# Patient Record
Sex: Female | Born: 1985 | Race: Black or African American | Hispanic: No | Marital: Married | Smoking: Never smoker
Health system: Southern US, Community
[De-identification: ages and names within clinical notes are randomized; demographics above are authoritative.]

## PROBLEM LIST (undated history)

## (undated) ENCOUNTER — Inpatient Hospital Stay (HOSPITAL_COMMUNITY): Payer: Self-pay

## (undated) DIAGNOSIS — K589 Irritable bowel syndrome without diarrhea: Secondary | ICD-10-CM

## (undated) DIAGNOSIS — O4693 Antepartum hemorrhage, unspecified, third trimester: Secondary | ICD-10-CM

## (undated) DIAGNOSIS — Z789 Other specified health status: Secondary | ICD-10-CM

## (undated) HISTORY — PX: DILATION AND CURETTAGE OF UTERUS: SHX78

## (undated) HISTORY — DX: Antepartum hemorrhage, unspecified, third trimester: O46.93

## (undated) HISTORY — PX: NO PAST SURGERIES: SHX2092

---
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
## 2010-04-03 NOTE — L&D Delivery Note (Deleted)
Cesarean Section Procedure Note   [name]   03/28/2011 - 03/29/2011  Indications: Breech Presentation and 39 weeks, active labor.  Pre-operative Diagnosis: Breech, 39.[redacted] Weeks Gestation, Labor.   Post-operative Diagnosis: Same   Surgeon: HARPER,CHARLES A  Assistants: Surgical technician  Anesthesia: spinal  Procedure Details:  The patient was seen in the Holding Room. The risks, benefits, complications, treatment options, and expected outcomes were discussed with the patient. The patient concurred with the proposed plan, giving informed consent. The patient was identified as Kathleen Logan and the procedure verified as C-Section Delivery. A Time Out was held and the above information confirmed.  After induction of anesthesia, the patient was draped and prepped in the usual sterile manner. A transverse incision was made and carried down through the subcutaneous tissue to the fascia. The fascial incision was made and extended transversely. The fascia was separated from the underlying rectus tissue superiorly and inferiorly. The peritoneum was identified and entered. The peritoneal incision was extended longitudinally. The utero-vesical peritoneal reflection was incised transversely and the bladder flap was bluntly freed from the lower uterine segment. A low transverse uterine incision was made. Delivered from cephalic presentation was a 3255 gram living newborn female i2nfant(s) with Apgar scores of 8 at one minute and 9 at five minutes. A cord ph was not sent. The umbilical cord was clamped and cut cord. A sample was obtained for evaluation. The placenta was removed Intact and appeared normal.  The uterine incision was closed with running locked sutures of 0 Monocryl.   Hemostasis was observed. The paracolic gutters were irrigated.  The peritoneum was closed with continuous suture of 2-O monocryl.  The fascia was then reapproximated with running sutures of O Vicryl.  Staples were  placed in the skin closure.  Sterile bandage placed.  Instrument, sponge, and needle counts were correct prior the abdominal closure and were correct at the conclusion of the case.    Findings:   Estimated Blood Loss: * No blood loss amount entered *   Total IV Fluids:   Urine Output: 800CC OF clear urine  Specimens: @ORSPECIMEN @   Complications: no complications  Disposition: PACU - hemodynamically stable.  Maternal Condition: stable   Baby condition / location:  nursery-stable    Signed: Surgeon(s): Brock Bad, MD

## 2010-04-12 ENCOUNTER — Inpatient Hospital Stay (HOSPITAL_COMMUNITY)
Admission: AD | Admit: 2010-04-12 | Discharge: 2010-04-12 | Payer: Self-pay | Source: Home / Self Care | Attending: Family Medicine | Admitting: Family Medicine

## 2010-04-18 LAB — URINALYSIS, ROUTINE W REFLEX MICROSCOPIC
Bilirubin Urine: NEGATIVE
Ketones, ur: NEGATIVE mg/dL
Leukocytes, UA: NEGATIVE
Nitrite: NEGATIVE
Protein, ur: NEGATIVE mg/dL
Specific Gravity, Urine: 1.02 (ref 1.005–1.030)
Urine Glucose, Fasting: NEGATIVE mg/dL
Urobilinogen, UA: 0.2 mg/dL (ref 0.0–1.0)
pH: 7 (ref 5.0–8.0)

## 2010-04-18 LAB — URINE MICROSCOPIC-ADD ON

## 2010-04-18 LAB — GC/CHLAMYDIA PROBE AMP, GENITAL
Chlamydia, DNA Probe: POSITIVE — AB
GC Probe Amp, Genital: NEGATIVE

## 2010-04-18 LAB — WET PREP, GENITAL
Trich, Wet Prep: NONE SEEN
Yeast Wet Prep HPF POC: NONE SEEN

## 2010-04-18 LAB — POCT PREGNANCY, URINE: Preg Test, Ur: NEGATIVE

## 2010-07-29 ENCOUNTER — Inpatient Hospital Stay (HOSPITAL_COMMUNITY)
Admission: AD | Admit: 2010-07-29 | Discharge: 2010-07-29 | Disposition: A | Discharge status: Home / Self Care | Payer: 59 | Source: Ambulatory Visit | Attending: Obstetrics and Gynecology | Admitting: Obstetrics and Gynecology

## 2010-07-29 DIAGNOSIS — O99891 Other specified diseases and conditions complicating pregnancy: Secondary | ICD-10-CM | POA: Insufficient documentation

## 2010-07-29 DIAGNOSIS — R109 Unspecified abdominal pain: Secondary | ICD-10-CM

## 2010-07-29 DIAGNOSIS — O210 Mild hyperemesis gravidarum: Secondary | ICD-10-CM | POA: Insufficient documentation

## 2010-07-29 DIAGNOSIS — R102 Pelvic and perineal pain unspecified side: Secondary | ICD-10-CM

## 2010-07-29 DIAGNOSIS — O26899 Other specified pregnancy related conditions, unspecified trimester: Secondary | ICD-10-CM

## 2010-07-29 DIAGNOSIS — O9989 Other specified diseases and conditions complicating pregnancy, childbirth and the puerperium: Secondary | ICD-10-CM

## 2010-07-29 LAB — URINALYSIS, ROUTINE W REFLEX MICROSCOPIC
Bilirubin Urine: NEGATIVE
Glucose, UA: NEGATIVE mg/dL
Ketones, ur: 40 mg/dL — AB
Leukocytes, UA: NEGATIVE
Nitrite: NEGATIVE
Protein, ur: NEGATIVE mg/dL
Specific Gravity, Urine: 1.02 (ref 1.005–1.030)
Urobilinogen, UA: 0.2 mg/dL (ref 0.0–1.0)
pH: 6.5 (ref 5.0–8.0)

## 2010-07-29 LAB — URINE MICROSCOPIC-ADD ON

## 2010-07-30 ENCOUNTER — Inpatient Hospital Stay (HOSPITAL_COMMUNITY): Payer: 59

## 2010-07-30 LAB — WET PREP, GENITAL
Trich, Wet Prep: NONE SEEN
Yeast Wet Prep HPF POC: NONE SEEN

## 2010-07-30 LAB — ABO/RH: ABO/RH(D): O POS

## 2010-07-30 LAB — CBC
HCT: 37.7 % (ref 36.0–46.0)
Hemoglobin: 13 g/dL (ref 12.0–15.0)
MCH: 31.9 pg (ref 26.0–34.0)
MCHC: 34.5 g/dL (ref 30.0–36.0)
MCV: 92.6 fL (ref 78.0–100.0)
Platelets: 273 10*3/uL (ref 150–400)
RBC: 4.07 MIL/uL (ref 3.87–5.11)
RDW: 12.3 % (ref 11.5–15.5)
WBC: 8.9 10*3/uL (ref 4.0–10.5)

## 2010-07-30 LAB — HCG, QUANTITATIVE, PREGNANCY: hCG, Beta Chain, Quant, S: 1543 m[IU]/mL — ABNORMAL HIGH (ref <5)

## 2010-08-01 LAB — GC/CHLAMYDIA PROBE AMP, GENITAL
Chlamydia, DNA Probe: NEGATIVE
GC Probe Amp, Genital: NEGATIVE

## 2010-08-01 LAB — POCT PREGNANCY, URINE: Preg Test, Ur: POSITIVE

## 2010-09-14 ENCOUNTER — Inpatient Hospital Stay (HOSPITAL_COMMUNITY)
Admission: AD | Admit: 2010-09-14 | Discharge: 2010-09-14 | Disposition: A | Discharge status: Home / Self Care | Payer: 59 | Source: Ambulatory Visit | Attending: Obstetrics | Admitting: Obstetrics

## 2010-09-14 LAB — HIV ANTIBODY (ROUTINE TESTING W REFLEX): HIV: NONREACTIVE

## 2010-09-16 LAB — RPR: RPR: NONREACTIVE

## 2010-09-16 LAB — ANTIBODY SCREEN: Antibody Screen: NEGATIVE

## 2010-11-14 ENCOUNTER — Other Ambulatory Visit (HOSPITAL_COMMUNITY): Payer: Self-pay | Admitting: Obstetrics

## 2010-11-14 DIAGNOSIS — Z3689 Encounter for other specified antenatal screening: Secondary | ICD-10-CM

## 2010-11-16 ENCOUNTER — Ambulatory Visit (HOSPITAL_COMMUNITY)
Admission: RE | Admit: 2010-11-16 | Discharge: 2010-11-16 | Disposition: A | Discharge status: Home / Self Care | Payer: 59 | Source: Ambulatory Visit | Attending: Obstetrics | Admitting: Obstetrics

## 2010-11-16 DIAGNOSIS — Z363 Encounter for antenatal screening for malformations: Secondary | ICD-10-CM | POA: Insufficient documentation

## 2010-11-16 DIAGNOSIS — Z3689 Encounter for other specified antenatal screening: Secondary | ICD-10-CM

## 2010-11-16 DIAGNOSIS — Z1389 Encounter for screening for other disorder: Secondary | ICD-10-CM | POA: Insufficient documentation

## 2010-11-16 DIAGNOSIS — O358XX0 Maternal care for other (suspected) fetal abnormality and damage, not applicable or unspecified: Secondary | ICD-10-CM | POA: Insufficient documentation

## 2011-01-15 ENCOUNTER — Encounter (HOSPITAL_COMMUNITY): Payer: Self-pay | Admitting: *Deleted

## 2011-01-15 ENCOUNTER — Inpatient Hospital Stay (HOSPITAL_COMMUNITY)
Admission: AD | Admit: 2011-01-15 | Discharge: 2011-01-15 | Disposition: A | Discharge status: Home / Self Care | Payer: 59 | Source: Ambulatory Visit | Attending: Obstetrics | Admitting: Obstetrics

## 2011-01-15 DIAGNOSIS — O99891 Other specified diseases and conditions complicating pregnancy: Secondary | ICD-10-CM | POA: Insufficient documentation

## 2011-01-15 DIAGNOSIS — R109 Unspecified abdominal pain: Secondary | ICD-10-CM | POA: Insufficient documentation

## 2011-01-15 DIAGNOSIS — N949 Unspecified condition associated with female genital organs and menstrual cycle: Secondary | ICD-10-CM

## 2011-01-15 HISTORY — DX: Other specified health status: Z78.9

## 2011-01-15 LAB — URINALYSIS, ROUTINE W REFLEX MICROSCOPIC
Bilirubin Urine: NEGATIVE
Glucose, UA: NEGATIVE mg/dL
Hgb urine dipstick: NEGATIVE
Ketones, ur: NEGATIVE mg/dL
Leukocytes, UA: NEGATIVE
Nitrite: NEGATIVE
Protein, ur: NEGATIVE mg/dL
Specific Gravity, Urine: 1.01 (ref 1.005–1.030)
Urobilinogen, UA: 0.2 mg/dL (ref 0.0–1.0)
pH: 7.5 (ref 5.0–8.0)

## 2011-01-15 LAB — WET PREP, GENITAL
Clue Cells Wet Prep HPF POC: NONE SEEN
Trich, Wet Prep: NONE SEEN
Yeast Wet Prep HPF POC: NONE SEEN

## 2011-01-15 LAB — POCT FERN TEST: Fern Test: NEGATIVE

## 2011-01-15 NOTE — ED Provider Notes (Signed)
History     Chief Complaint  Patient presents with  . Abdominal Pain   Patient is a 25 y.o. female presenting with abdominal pain.  Abdominal Pain The primary symptoms of the illness include abdominal pain. The primary symptoms of the illness do not include nausea or vomiting.  Kathleen Logan y.o., patient of Dr. Elsie Stain presents with complaints of abdominal pain that began this morning around 10am.  She is now [redacted]w[redacted]d gestation.  She points to the suprapubic area and describes as stretching, burning, cramping and sometimes sharp.  She denies vaginal bleeding, leaking of fluid or discharge.  She denies contractions, nausea and vomiting.   She last had intercourse 2 months ago.  Last ate last night.      Past Medical History  Diagnosis Date  . No pertinent past medical history     Past Surgical History  Procedure Date  . No past surgeries     No family history on file.  History  Substance Use Topics  . Smoking status: Never Smoker   . Smokeless tobacco: Not on file  . Alcohol Use: No    Allergies: No Known Allergies  Prescriptions prior to admission  Medication Sig Dispense Refill  . acetaminophen (TYLENOL) 325 MG tablet Take 650 mg by mouth every 6 (six) hours as needed.        . prenatal vitamin w/FE, FA (PRENATAL 1 + 1) 27-1 MG TABS Take 1 tablet by mouth daily.          Review of Systems  Constitutional: Negative.   Gastrointestinal: Positive for abdominal pain. Negative for nausea and vomiting.  Genitourinary:       Neg for vaginal bleeding, leaking of fluid, and discharge   Physical Exam   Blood pressure 115/65, pulse 90, temperature 98.4 F (36.9 C), temperature source Oral, resp. rate 18, height 5' 5.5" (1.664 m), weight 145 lb (65.772 kg).  Physical Exam  Nursing note and vitals reviewed. Constitutional: She is oriented to person, place, and time. She appears well-developed and well-nourished.  HENT:  Head: Normocephalic.  Neck: Normal range of  motion.  Respiratory: Effort normal.  GI: Soft. There is Tenderness: lower abdominal, suprapubic.. There is no rebound and no guarding.  Genitourinary: Vaginal discharge (thick, white) found.       Cervical exam by Blanche East, RN.  Closed, thick soft.  Neurological: She is alert and oriented to person, place, and time.  Skin: Skin is warm and dry.   Results for orders placed during the hospital encounter of 01/15/11 (from the past 24 hour(s))  URINALYSIS, ROUTINE W REFLEX MICROSCOPIC     Status: Abnormal   Collection Time   01/15/11 12:13 PM      Component Value Range   Color, Urine STRAW (*) YELLOW    Appearance CLEAR  CLEAR    Specific Gravity, Urine 1.010  1.005 - 1.030    pH 7.5  5.0 - 8.0    Glucose, UA NEGATIVE  NEGATIVE (mg/dL)   Hgb urine dipstick NEGATIVE  NEGATIVE    Bilirubin Urine NEGATIVE  NEGATIVE    Ketones, ur NEGATIVE  NEGATIVE (mg/dL)   Protein, ur NEGATIVE  NEGATIVE (mg/dL)   Urobilinogen, UA 0.2  0.0 - 1.0 (mg/dL)   Nitrite NEGATIVE  NEGATIVE    Leukocytes, UA NEGATIVE  NEGATIVE   WET PREP, GENITAL     Status: Abnormal   Collection Time   01/15/11  1:50 PM      Component Value Range  Yeast, Wet Prep NONE SEEN  NONE SEEN    Trich, Wet Prep NONE SEEN  NONE SEEN    Clue Cells, Wet Prep NONE SEEN  NONE SEEN    WBC, Wet Prep HPF POC FEW (*) NONE SEEN   POCT FERN TEST     Status: Normal   Collection Time   01/15/11  2:02 PM      Component Value Range   Fern Test Negative     MAU Course  Procedures   Cervical exam by Blanche East, RN closed, soft and thick.  Large amount of white discharge.  Wet prep sent to lab.  MDM 13:50 Reported patients MSE and FM findings to Dr. Tamela Oddi.  Orders given for cervical exam .  Sent FFN if cervical changes. 14:20  Reported cervical and  lab findings. FMS findings also reported.  No further contractions with hydration. Order given to discharge patient. Assessment and Plan  A:  Round ligament pain   P:  Dr. Tamela Oddi  gave instructions for patient to call Dr. Gaynell Face and be seen tomorrow.      Instructed patient to return should her symptoms change--vaginal bleeding, increased abdominal pain or loss of fluid.  Avoid intercourse until she sees Dr. Gaynell Face.  Kathleen Logan,EVE M 01/15/2011, 1:25 PM   Matt Holmes, NP 01/15/11 1433

## 2011-01-15 NOTE — Progress Notes (Signed)
Pt is a G1P0 at 29 weeks. Presents to MAU with complaints of intermt. Lower abdominal pain that started this morning. No complaints of vag. Discharge or bleeding.

## 2011-01-15 NOTE — ED Notes (Signed)
During cervical exam done by RN, RN observed large amounts of vaginal discharge (white) notified E. Key NP wet prep obtained.

## 2011-01-15 NOTE — Progress Notes (Signed)
Pt reports having lower abd pain and pressure and back pain /discomfort that started this morning. Pain comes and goes. Reports good fetal movement. Denies vaginal bleeding or discharge

## 2011-03-22 LAB — STREP B DNA PROBE: GBS: NEGATIVE

## 2011-03-28 ENCOUNTER — Inpatient Hospital Stay (HOSPITAL_COMMUNITY)
Admission: AD | Admit: 2011-03-28 | Discharge: 2011-04-01 | DRG: 766 | Disposition: A | Discharge status: Home / Self Care | Payer: Managed Care, Other (non HMO) | Source: Ambulatory Visit | Attending: Obstetrics | Admitting: Obstetrics

## 2011-03-28 ENCOUNTER — Encounter (HOSPITAL_COMMUNITY): Payer: Self-pay | Admitting: *Deleted

## 2011-03-28 DIAGNOSIS — Z98891 History of uterine scar from previous surgery: Secondary | ICD-10-CM

## 2011-03-28 DIAGNOSIS — O321XX0 Maternal care for breech presentation, not applicable or unspecified: Principal | ICD-10-CM | POA: Diagnosis present

## 2011-03-28 LAB — POCT FERN TEST
Fern Test: NEGATIVE
Fern Test: POSITIVE

## 2011-03-28 LAB — CBC
HCT: 32.9 % — ABNORMAL LOW (ref 36.0–46.0)
Hemoglobin: 11.5 g/dL — ABNORMAL LOW (ref 12.0–15.0)
RBC: 3.63 MIL/uL — ABNORMAL LOW (ref 3.87–5.11)

## 2011-03-28 LAB — RPR: RPR Ser Ql: NONREACTIVE

## 2011-03-28 MED ORDER — ACETAMINOPHEN 325 MG PO TABS: 650.0000 mg | ORAL_TABLET | ORAL | Status: DC | PRN

## 2011-03-28 MED ORDER — CITRIC ACID-SODIUM CITRATE 334-500 MG/5ML PO SOLN
30.0000 mL | ORAL | Status: DC | PRN
  Administered 2011-03-29: 30 mL via ORAL
  Filled 2011-03-28: qty 15

## 2011-03-28 MED ORDER — ONDANSETRON HCL 4 MG/2ML IJ SOLN: 4.0000 mg | Freq: Four times a day (QID) | INTRAMUSCULAR | Status: DC | PRN

## 2011-03-28 MED ORDER — LIDOCAINE HCL (PF) 1 % IJ SOLN: 30.0000 mL | INTRAMUSCULAR | Status: DC | PRN

## 2011-03-28 MED ORDER — LACTATED RINGERS IV SOLN
INTRAVENOUS | Status: DC
  Administered 2011-03-28: 21:00:00 via INTRAVENOUS
  Administered 2011-03-28: 900 mL via INTRAVENOUS
  Administered 2011-03-29: 01:00:00 via INTRAVENOUS

## 2011-03-28 MED ORDER — OXYTOCIN 20 UNITS IN LACTATED RINGERS INFUSION - SIMPLE: 125.0000 mL/h | Freq: Once | INTRAVENOUS | Status: DC

## 2011-03-28 MED ORDER — NALBUPHINE HCL 10 MG/ML IJ SOLN
10.0000 mg | INTRAMUSCULAR | Status: DC | PRN
  Administered 2011-03-28 (×2): 10 mg via INTRAVENOUS

## 2011-03-28 MED ORDER — OXYCODONE-ACETAMINOPHEN 5-325 MG PO TABS: 2.0000 | ORAL_TABLET | ORAL | Status: DC | PRN

## 2011-03-28 MED ORDER — OXYTOCIN BOLUS FROM INFUSION
500.0000 mL | Freq: Once | INTRAVENOUS | Status: DC
  Filled 2011-03-28: qty 1000
  Filled 2011-03-28: qty 500

## 2011-03-28 MED ORDER — OXYTOCIN 20 UNITS IN LACTATED RINGERS INFUSION - SIMPLE
1.0000 m[IU]/min | INTRAVENOUS | Status: DC
  Administered 2011-03-28: 2 m[IU]/min via INTRAVENOUS

## 2011-03-28 MED ORDER — NALBUPHINE SYRINGE 5 MG/0.5 ML
10.0000 mg | INJECTION | Freq: Four times a day (QID) | INTRAMUSCULAR | Status: DC | PRN
  Administered 2011-03-28: 10 mg via INTRAMUSCULAR
  Filled 2011-03-28: qty 0.5
  Filled 2011-03-28: qty 2
  Filled 2011-03-28: qty 0.5

## 2011-03-28 MED ORDER — OXYTOCIN 10 UNIT/ML IJ SOLN
20.0000 [IU] | INTRAVENOUS | Status: DC
  Administered 2011-03-29: 20 [IU] via INTRAVENOUS
  Filled 2011-03-28: qty 2

## 2011-03-28 MED ORDER — IBUPROFEN 600 MG PO TABS: 600.0000 mg | ORAL_TABLET | Freq: Four times a day (QID) | ORAL | Status: DC | PRN

## 2011-03-28 MED ORDER — FLEET ENEMA 7-19 GM/118ML RE ENEM: 1.0000 | ENEMA | RECTAL | Status: DC | PRN

## 2011-03-28 MED ORDER — LACTATED RINGERS IV SOLN
500.0000 mL | INTRAVENOUS | Status: DC | PRN
  Administered 2011-03-29: 500 mL via INTRAVENOUS

## 2011-03-28 MED ORDER — TERBUTALINE SULFATE 1 MG/ML IJ SOLN: 0.2500 mg | Freq: Once | INTRAMUSCULAR | Status: AC | PRN

## 2011-03-28 MED ORDER — HYDROXYZINE HCL 50 MG/ML IM SOLN
50.0000 mg | Freq: Four times a day (QID) | INTRAMUSCULAR | Status: DC | PRN
  Administered 2011-03-28: 50 mg via INTRAMUSCULAR
  Filled 2011-03-28: qty 1

## 2011-03-28 NOTE — Progress Notes (Signed)
Pt c/o leaking, clear, fluid since 0500.  Changed underware X3 this morning.

## 2011-03-28 NOTE — Progress Notes (Signed)
Patient states she has had three episodes of leaking clear fluid since 0530. Has been having contractions. Reports good fetal movement.

## 2011-03-28 NOTE — Progress Notes (Signed)
First Fern test negative.  NP to do sterile spec to rule out ROM.

## 2011-03-29 ENCOUNTER — Encounter (HOSPITAL_COMMUNITY): Payer: Self-pay

## 2011-03-29 ENCOUNTER — Inpatient Hospital Stay (HOSPITAL_COMMUNITY): Payer: Managed Care, Other (non HMO) | Admitting: Anesthesiology

## 2011-03-29 ENCOUNTER — Inpatient Hospital Stay (HOSPITAL_COMMUNITY): Payer: Managed Care, Other (non HMO)

## 2011-03-29 ENCOUNTER — Encounter (HOSPITAL_COMMUNITY): Payer: Self-pay | Admitting: Anesthesiology

## 2011-03-29 ENCOUNTER — Encounter (HOSPITAL_COMMUNITY)
Admission: AD | Disposition: A | Discharge status: Home / Self Care | Payer: Self-pay | Source: Ambulatory Visit | Attending: Obstetrics

## 2011-03-29 ENCOUNTER — Other Ambulatory Visit: Payer: Self-pay | Admitting: Obstetrics

## 2011-03-29 DIAGNOSIS — Z98891 History of uterine scar from previous surgery: Secondary | ICD-10-CM

## 2011-03-29 SURGERY — Surgical Case
Anesthesia: Spinal | Site: Abdomen | Wound class: Clean Contaminated

## 2011-03-29 MED ORDER — FENTANYL CITRATE 0.05 MG/ML IJ SOLN
INTRAMUSCULAR | Status: AC
  Filled 2011-03-29: qty 2

## 2011-03-29 MED ORDER — PHENYLEPHRINE 40 MCG/ML (10ML) SYRINGE FOR IV PUSH (FOR BLOOD PRESSURE SUPPORT)
80.0000 ug | PREFILLED_SYRINGE | INTRAVENOUS | Status: DC | PRN
  Filled 2011-03-29: qty 5

## 2011-03-29 MED ORDER — DIPHENHYDRAMINE HCL 50 MG/ML IJ SOLN: 12.5000 mg | INTRAMUSCULAR | Status: DC | PRN

## 2011-03-29 MED ORDER — DIPHENHYDRAMINE HCL 50 MG/ML IJ SOLN: 25.0000 mg | INTRAMUSCULAR | Status: DC | PRN

## 2011-03-29 MED ORDER — SENNOSIDES-DOCUSATE SODIUM 8.6-50 MG PO TABS
2.0000 | ORAL_TABLET | Freq: Every day | ORAL | Status: DC
  Administered 2011-03-29 – 2011-03-31 (×3): 2 via ORAL

## 2011-03-29 MED ORDER — FENTANYL CITRATE 0.05 MG/ML IJ SOLN
INTRAMUSCULAR | Status: DC | PRN
  Administered 2011-03-29: 25 ug via INTRAVENOUS
  Administered 2011-03-29: 25 ug via INTRATHECAL
  Administered 2011-03-29: 50 ug via INTRAVENOUS

## 2011-03-29 MED ORDER — MORPHINE SULFATE (PF) 0.5 MG/ML IJ SOLN
INTRAMUSCULAR | Status: DC | PRN
  Administered 2011-03-29: 1 mg via INTRAVENOUS
  Administered 2011-03-29 (×2): 1 mg via EPIDURAL
  Administered 2011-03-29: .85 mg via EPIDURAL
  Administered 2011-03-29: 1 mg via EPIDURAL

## 2011-03-29 MED ORDER — ACETAMINOPHEN 325 MG PO TABS: 650.0000 mg | ORAL_TABLET | Freq: Four times a day (QID) | ORAL | Status: DC | PRN

## 2011-03-29 MED ORDER — MEPERIDINE HCL 25 MG/ML IJ SOLN: 6.2500 mg | INTRAMUSCULAR | Status: DC | PRN

## 2011-03-29 MED ORDER — MORPHINE SULFATE (PF) 0.5 MG/ML IJ SOLN
INTRAMUSCULAR | Status: DC | PRN
  Administered 2011-03-29: .15 mg via INTRATHECAL

## 2011-03-29 MED ORDER — METOCLOPRAMIDE HCL 5 MG/ML IJ SOLN
10.0000 mg | Freq: Three times a day (TID) | INTRAMUSCULAR | Status: DC | PRN
Start: 2011-03-29 — End: 2011-04-01

## 2011-03-29 MED ORDER — EPHEDRINE 5 MG/ML INJ: 10.0000 mg | INTRAVENOUS | Status: DC | PRN

## 2011-03-29 MED ORDER — METHYLERGONOVINE MALEATE 0.2 MG/ML IJ SOLN
0.2000 mg | INTRAMUSCULAR | Status: DC | PRN
  Administered 2011-03-29: 0.2 mg via INTRAMUSCULAR

## 2011-03-29 MED ORDER — SIMETHICONE 80 MG PO CHEW: 80.0000 mg | CHEWABLE_TABLET | ORAL | Status: DC | PRN

## 2011-03-29 MED ORDER — KETOROLAC TROMETHAMINE 30 MG/ML IJ SOLN
INTRAMUSCULAR | Status: AC
  Administered 2011-03-29: 30 mg via INTRAVENOUS
  Filled 2011-03-29: qty 1

## 2011-03-29 MED ORDER — TETANUS-DIPHTH-ACELL PERTUSSIS 5-2.5-18.5 LF-MCG/0.5 IM SUSP: 0.5000 mL | Freq: Once | INTRAMUSCULAR | Status: DC

## 2011-03-29 MED ORDER — MIDAZOLAM HCL 2 MG/2ML IJ SOLN
INTRAMUSCULAR | Status: AC
  Filled 2011-03-29: qty 2

## 2011-03-29 MED ORDER — WITCH HAZEL-GLYCERIN EX PADS: 1.0000 "application " | MEDICATED_PAD | CUTANEOUS | Status: DC | PRN

## 2011-03-29 MED ORDER — OXYTOCIN 20 UNITS IN LACTATED RINGERS INFUSION - SIMPLE
INTRAVENOUS | Status: AC
  Filled 2011-03-29: qty 1000

## 2011-03-29 MED ORDER — IBUPROFEN 600 MG PO TABS
600.0000 mg | ORAL_TABLET | Freq: Four times a day (QID) | ORAL | Status: DC
  Administered 2011-03-29 – 2011-04-01 (×14): 600 mg via ORAL
  Filled 2011-03-29 (×12): qty 1
  Filled 2011-03-29: qty 2

## 2011-03-29 MED ORDER — METHYLERGONOVINE MALEATE 0.2 MG PO TABS: 0.2000 mg | ORAL_TABLET | ORAL | Status: DC | PRN

## 2011-03-29 MED ORDER — ZOLPIDEM TARTRATE 5 MG PO TABS: 5.0000 mg | ORAL_TABLET | Freq: Every evening | ORAL | Status: DC | PRN

## 2011-03-29 MED ORDER — EPHEDRINE 5 MG/ML INJ
10.0000 mg | INTRAVENOUS | Status: DC | PRN
  Filled 2011-03-29: qty 4

## 2011-03-29 MED ORDER — MENTHOL 3 MG MT LOZG: 1.0000 | LOZENGE | OROMUCOSAL | Status: DC | PRN

## 2011-03-29 MED ORDER — OXYTOCIN 10 UNIT/ML IJ SOLN
INTRAMUSCULAR | Status: AC
  Filled 2011-03-29: qty 4

## 2011-03-29 MED ORDER — MEDROXYPROGESTERONE ACETATE 150 MG/ML IM SUSP: 150.0000 mg | INTRAMUSCULAR | Status: DC | PRN

## 2011-03-29 MED ORDER — PRENATAL MULTIVITAMIN CH
1.0000 | ORAL_TABLET | Freq: Every day | ORAL | Status: DC
  Administered 2011-03-29 – 2011-03-31 (×4): 1 via ORAL
  Filled 2011-03-29 (×4): qty 1

## 2011-03-29 MED ORDER — IBUPROFEN 600 MG PO TABS: 600.0000 mg | ORAL_TABLET | Freq: Four times a day (QID) | ORAL | Status: DC | PRN

## 2011-03-29 MED ORDER — NALBUPHINE SYRINGE 5 MG/0.5 ML
5.0000 mg | INJECTION | INTRAMUSCULAR | Status: DC | PRN
  Filled 2011-03-29: qty 1

## 2011-03-29 MED ORDER — ONDANSETRON HCL 4 MG/2ML IJ SOLN: 4.0000 mg | INTRAMUSCULAR | Status: DC | PRN

## 2011-03-29 MED ORDER — KETOROLAC TROMETHAMINE 30 MG/ML IJ SOLN: 30.0000 mg | Freq: Four times a day (QID) | INTRAMUSCULAR | Status: AC | PRN

## 2011-03-29 MED ORDER — DIPHENHYDRAMINE HCL 25 MG PO CAPS: 25.0000 mg | ORAL_CAPSULE | Freq: Four times a day (QID) | ORAL | Status: DC | PRN

## 2011-03-29 MED ORDER — METHYLERGONOVINE MALEATE 0.2 MG/ML IJ SOLN
INTRAMUSCULAR | Status: AC
  Administered 2011-03-29: 0.2 mg via INTRAMUSCULAR
  Filled 2011-03-29: qty 1

## 2011-03-29 MED ORDER — SODIUM CHLORIDE 0.9 % IV SOLN
1.0000 ug/kg/h | INTRAVENOUS | Status: DC | PRN
  Filled 2011-03-29: qty 2.5

## 2011-03-29 MED ORDER — SODIUM CHLORIDE 0.9 % IJ SOLN: 3.0000 mL | INTRAMUSCULAR | Status: DC | PRN

## 2011-03-29 MED ORDER — CEFAZOLIN SODIUM 1-5 GM-% IV SOLN
INTRAVENOUS | Status: DC | PRN
  Administered 2011-03-29: 1 g via INTRAVENOUS

## 2011-03-29 MED ORDER — SCOPOLAMINE 1 MG/3DAYS TD PT72
1.0000 | MEDICATED_PATCH | Freq: Once | TRANSDERMAL | Status: AC
  Administered 2011-03-29: 1.5 mg via TRANSDERMAL

## 2011-03-29 MED ORDER — OXYTOCIN 20 UNITS IN LACTATED RINGERS INFUSION - SIMPLE: 125.0000 mL/h | INTRAVENOUS | Status: AC

## 2011-03-29 MED ORDER — MEPERIDINE HCL 25 MG/ML IJ SOLN
INTRAMUSCULAR | Status: AC
  Filled 2011-03-29: qty 1

## 2011-03-29 MED ORDER — METOCLOPRAMIDE HCL 5 MG/ML IJ SOLN: 10.0000 mg | Freq: Once | INTRAMUSCULAR | Status: DC | PRN

## 2011-03-29 MED ORDER — PRENATAL MULTIVITAMIN CH: 1.0000 | ORAL_TABLET | Freq: Every day | ORAL | Status: DC

## 2011-03-29 MED ORDER — FENTANYL CITRATE 0.05 MG/ML IJ SOLN: 50.0000 ug | Freq: Once | INTRAMUSCULAR | Status: DC

## 2011-03-29 MED ORDER — FENTANYL CITRATE 0.05 MG/ML IJ SOLN
INTRAMUSCULAR | Status: AC
  Administered 2011-03-29: 50 ug via INTRAVENOUS
  Filled 2011-03-29: qty 2

## 2011-03-29 MED ORDER — ONDANSETRON HCL 4 MG/2ML IJ SOLN
INTRAMUSCULAR | Status: DC | PRN
  Administered 2011-03-29: 4 mg via INTRAVENOUS

## 2011-03-29 MED ORDER — MORPHINE SULFATE 0.5 MG/ML IJ SOLN
INTRAMUSCULAR | Status: AC
  Filled 2011-03-29: qty 10

## 2011-03-29 MED ORDER — LACTATED RINGERS IV SOLN
INTRAVENOUS | Status: DC
Start: 2011-03-29 — End: 2011-04-01
  Administered 2011-03-29: 11:00:00 via INTRAVENOUS

## 2011-03-29 MED ORDER — MIDAZOLAM HCL 5 MG/5ML IJ SOLN
INTRAMUSCULAR | Status: DC | PRN
  Administered 2011-03-29: 1 mg via INTRAVENOUS

## 2011-03-29 MED ORDER — DIPHENHYDRAMINE HCL 25 MG PO CAPS: 25.0000 mg | ORAL_CAPSULE | ORAL | Status: DC | PRN

## 2011-03-29 MED ORDER — SIMETHICONE 80 MG PO CHEW
80.0000 mg | CHEWABLE_TABLET | Freq: Three times a day (TID) | ORAL | Status: DC
  Administered 2011-03-29 – 2011-03-31 (×10): 80 mg via ORAL

## 2011-03-29 MED ORDER — ONDANSETRON HCL 4 MG/2ML IJ SOLN
INTRAMUSCULAR | Status: AC
  Filled 2011-03-29: qty 2

## 2011-03-29 MED ORDER — NALOXONE HCL 0.4 MG/ML IJ SOLN: 0.4000 mg | INTRAMUSCULAR | Status: DC | PRN

## 2011-03-29 MED ORDER — MEPERIDINE HCL 25 MG/ML IJ SOLN
INTRAMUSCULAR | Status: DC | PRN
  Administered 2011-03-29: 6 mg via INTRAVENOUS
  Administered 2011-03-29: 7 mg via INTRAVENOUS
  Administered 2011-03-29 (×2): 6 mg via INTRAVENOUS

## 2011-03-29 MED ORDER — SCOPOLAMINE 1 MG/3DAYS TD PT72
MEDICATED_PATCH | TRANSDERMAL | Status: AC
  Administered 2011-03-29: 1.5 mg via TRANSDERMAL
  Filled 2011-03-29: qty 1

## 2011-03-29 MED ORDER — OXYCODONE-ACETAMINOPHEN 5-325 MG PO TABS
1.0000 | ORAL_TABLET | ORAL | Status: DC | PRN
  Administered 2011-03-29: 1 via ORAL
  Administered 2011-03-29 – 2011-03-30 (×3): 2 via ORAL
  Administered 2011-03-30 – 2011-04-01 (×3): 1 via ORAL
  Filled 2011-03-29 (×2): qty 1
  Filled 2011-03-29: qty 2
  Filled 2011-03-29: qty 1
  Filled 2011-03-29: qty 2
  Filled 2011-03-29: qty 1
  Filled 2011-03-29: qty 2

## 2011-03-29 MED ORDER — OXYTOCIN 10 UNIT/ML IJ SOLN
INTRAMUSCULAR | Status: AC
  Filled 2011-03-29: qty 2

## 2011-03-29 MED ORDER — LACTATED RINGERS IV SOLN
INTRAVENOUS | Status: DC | PRN
  Administered 2011-03-29 (×3): via INTRAVENOUS

## 2011-03-29 MED ORDER — FENTANYL 2.5 MCG/ML BUPIVACAINE 1/10 % EPIDURAL INFUSION (WH - ANES)
14.0000 mL/h | INTRAMUSCULAR | Status: DC
  Filled 2011-03-29: qty 60

## 2011-03-29 MED ORDER — CEFAZOLIN SODIUM 1-5 GM-% IV SOLN
INTRAVENOUS | Status: AC
  Filled 2011-03-29: qty 50

## 2011-03-29 MED ORDER — KETOROLAC TROMETHAMINE 30 MG/ML IJ SOLN
30.0000 mg | Freq: Four times a day (QID) | INTRAMUSCULAR | Status: AC | PRN
  Administered 2011-03-29: 30 mg via INTRAVENOUS

## 2011-03-29 MED ORDER — LACTATED RINGERS IV SOLN: 500.0000 mL | Freq: Once | INTRAVENOUS | Status: DC

## 2011-03-29 MED ORDER — OXYTOCIN 10 UNIT/ML IJ SOLN
40.0000 [IU] | Freq: Once | INTRAVENOUS | Status: AC
  Administered 2011-03-29: 40 [IU] via INTRAVENOUS

## 2011-03-29 MED ORDER — LANOLIN HYDROUS EX OINT: 1.0000 "application " | TOPICAL_OINTMENT | CUTANEOUS | Status: DC | PRN

## 2011-03-29 MED ORDER — ONDANSETRON HCL 4 MG/2ML IJ SOLN: 4.0000 mg | Freq: Three times a day (TID) | INTRAMUSCULAR | Status: DC | PRN

## 2011-03-29 MED ORDER — PHENYLEPHRINE 40 MCG/ML (10ML) SYRINGE FOR IV PUSH (FOR BLOOD PRESSURE SUPPORT): 80.0000 ug | PREFILLED_SYRINGE | INTRAVENOUS | Status: DC | PRN

## 2011-03-29 MED ORDER — FENTANYL CITRATE 0.05 MG/ML IJ SOLN
25.0000 ug | INTRAMUSCULAR | Status: DC | PRN
  Administered 2011-03-29 (×4): 50 ug via INTRAVENOUS

## 2011-03-29 MED ORDER — DIBUCAINE 1 % RE OINT: 1.0000 "application " | TOPICAL_OINTMENT | RECTAL | Status: DC | PRN

## 2011-03-29 MED ORDER — SODIUM CHLORIDE 0.9 % IR SOLN
Status: DC | PRN
  Administered 2011-03-29: 1000 mL

## 2011-03-29 MED ORDER — ONDANSETRON HCL 4 MG PO TABS: 4.0000 mg | ORAL_TABLET | ORAL | Status: DC | PRN

## 2011-03-29 MED ORDER — BUPIVACAINE IN DEXTROSE 0.75-8.25 % IT SOLN
INTRATHECAL | Status: DC | PRN
  Administered 2011-03-29: 1.7 mL via INTRATHECAL

## 2011-03-29 SURGICAL SUPPLY — 42 items
ADH SKN CLS APL DERMABOND .7 (GAUZE/BANDAGES/DRESSINGS) ×1
CANISTER WOUND CARE 500ML ATS (WOUND CARE) IMPLANT
CHLORAPREP W/TINT 26ML (MISCELLANEOUS) ×2 IMPLANT
CLOTH BEACON ORANGE TIMEOUT ST (SAFETY) ×2 IMPLANT
CONTAINER PREFILL 10% NBF 15ML (MISCELLANEOUS) ×2 IMPLANT
DERMABOND ADVANCED (GAUZE/BANDAGES/DRESSINGS) ×1
DERMABOND ADVANCED .7 DNX12 (GAUZE/BANDAGES/DRESSINGS) ×1 IMPLANT
DRSG COVADERM 4X6 (GAUZE/BANDAGES/DRESSINGS) ×1 IMPLANT
DRSG VAC ATS LRG SENSATRAC (GAUZE/BANDAGES/DRESSINGS) IMPLANT
DRSG VAC ATS MED SENSATRAC (GAUZE/BANDAGES/DRESSINGS) IMPLANT
DRSG VAC ATS SM SENSATRAC (GAUZE/BANDAGES/DRESSINGS) IMPLANT
ELECT REM PT RETURN 9FT ADLT (ELECTROSURGICAL) ×2
ELECTRODE REM PT RTRN 9FT ADLT (ELECTROSURGICAL) ×1 IMPLANT
EXTRACTOR VACUUM M CUP 4 TUBE (SUCTIONS) IMPLANT
GLOVE BIO SURGEON STRL SZ8 (GLOVE) ×4 IMPLANT
GLOVE SKINSENSE NS SZ7.0 (GLOVE) ×2
GLOVE SKINSENSE STRL SZ7.0 (GLOVE) IMPLANT
GOWN PREVENTION PLUS LG XLONG (DISPOSABLE) ×4 IMPLANT
GOWN PREVENTION PLUS XLARGE (GOWN DISPOSABLE) ×2 IMPLANT
KIT ABG SYR 3ML LUER SLIP (SYRINGE) IMPLANT
NDL HYPO 25X5/8 SAFETYGLIDE (NEEDLE) ×1 IMPLANT
NEEDLE HYPO 25X5/8 SAFETYGLIDE (NEEDLE) IMPLANT
NS IRRIG 1000ML POUR BTL (IV SOLUTION) ×2 IMPLANT
PACK C SECTION WH (CUSTOM PROCEDURE TRAY) ×2 IMPLANT
RTRCTR C-SECT PINK 25CM LRG (MISCELLANEOUS) IMPLANT
SLEEVE SCD COMPRESS KNEE MED (MISCELLANEOUS) ×1 IMPLANT
STAPLER VISISTAT 35W (STAPLE) ×1 IMPLANT
SUT GUT PLAIN 0 CT-3 TAN 27 (SUTURE) ×1 IMPLANT
SUT MNCRL 0 VIOLET CTX 36 (SUTURE) ×3 IMPLANT
SUT MNCRL AB 4-0 PS2 18 (SUTURE) IMPLANT
SUT MON AB 2-0 CT1 27 (SUTURE) ×2 IMPLANT
SUT MON AB 3-0 SH 27 (SUTURE)
SUT MON AB 3-0 SH27 (SUTURE) IMPLANT
SUT MONOCRYL 0 CTX 36 (SUTURE) ×3
SUT PLAIN 2 0 XLH (SUTURE) IMPLANT
SUT VIC AB 0 CTX 36 (SUTURE) ×4
SUT VIC AB 0 CTX36XBRD ANBCTRL (SUTURE) ×2 IMPLANT
SUT VIC AB 2-0 CT1 27 (SUTURE)
SUT VIC AB 2-0 CT1 TAPERPNT 27 (SUTURE) IMPLANT
TOWEL OR 17X24 6PK STRL BLUE (TOWEL DISPOSABLE) ×4 IMPLANT
TRAY FOLEY CATH 14FR (SET/KITS/TRAYS/PACK) ×2 IMPLANT
WATER STERILE IRR 1000ML POUR (IV SOLUTION) ×1 IMPLANT

## 2011-03-29 NOTE — Op Note (Signed)
Brock Bad, MD Physician Signed Obstetrics L&D Delivery 03/29/2011 3:00 AM  Cesarean Section Procedure Note   Kathleen Logan   03/28/2011 - 03/29/2011  Indications: Breech Presentation and 39 weeks, active labor.  Pre-operative Diagnosis: Breech, 39.[redacted] Weeks Gestation, Labor.   Post-operative Diagnosis: Same   Surgeon: Alexanderia Gorby A  Assistants: Surgical technician  Anesthesia: spinal  Procedure Details:   The patient was seen in the Holding Room. The risks, benefits, complications, treatment options, and expected outcomes were discussed with the patient. The patient concurred with the proposed plan, giving informed consent. The patient was identified as [name] and the procedure verified as C-Section Delivery. A Time Out was held and the above information confirmed.   After induction of anesthesia, the patient was draped and prepped in the usual sterile manner. A transverse incision was made and carried down through the subcutaneous tissue to the fascia. The fascial incision was made and extended transversely. The fascia was separated from the underlying rectus tissue superiorly and inferiorly. The peritoneum was identified and entered. The peritoneal incision was extended longitudinally. The utero-vesical peritoneal reflection was incised transversely and the bladder flap was bluntly freed from the lower uterine segment. A low transverse uterine incision was made. Delivered from cephalic presentation was a 3255 gram living newborn female i2nfant(s) with Apgar scores of 8 at one minute and 9 at five minutes. A cord ph was not sent. The umbilical cord was clamped and cut cord. A sample was obtained for evaluation. The placenta was removed Intact and appeared normal.  The uterine incision was closed with running locked sutures of 0 Monocryl.   Hemostasis was observed. The paracolic gutters were irrigated.  The peritoneum was closed with continuous suture of 2-O monocryl.   The fascia was then reapproximated with running sutures of O Vicryl.  Staples were placed in the skin closure.  Sterile bandage placed.   Instrument, sponge, and needle counts were correct prior the abdominal closure and were correct at the conclusion of the case.    Findings:    Estimated Blood Loss: * No blood loss amount entered *   Total IV Fluids:   Urine Output: 800CC OF clear urine  Specimens: @ORSPECIMEN @   Complications: no complications  Disposition: PACU - hemodynamically stable.  Maternal Condition: stable   Baby condition / location:  nursery-stable    Signed: Surgeon(s): Brock Bad, MD   Addendum:  Infant delivered from breech position.

## 2011-03-29 NOTE — Addendum Note (Signed)
Addendum  created 03/29/11 0815 by Madison Hickman   Modules edited:Notes Section

## 2011-03-29 NOTE — Anesthesia Procedure Notes (Signed)
Spinal  Patient location during procedure: OR Start time: 03/29/2011 2:06 AM Staffing Anesthesiologist: Kianni Lheureux A. Performed by: anesthesiologist  Preanesthetic Checklist Completed: patient identified, site marked, surgical consent, pre-op evaluation, timeout performed, IV checked, risks and benefits discussed and monitors and equipment checked Spinal Block Patient position: sitting Prep: site prepped and draped and DuraPrep Patient monitoring: heart rate, cardiac monitor, continuous pulse ox and blood pressure Approach: midline Location: L3-4 Injection technique: single-shot Needle Needle type: Sprotte  Needle gauge: 24 G Needle length: 9 cm Needle insertion depth: 5 cm Assessment Sensory level: T4 Additional Notes Patient tolerated procedure well. Adequate sensory level.

## 2011-03-29 NOTE — Progress Notes (Signed)
Kathleen Logan is a 25 y.o. G1P0000 at [redacted]w[redacted]d by LMP admitted for SROM.  Subjective:   Objective: BP 160/85  Pulse 93  Temp(Src) 98 F (36.7 C) (Oral)  Resp 24  Ht 5\' 6"  (1.676 m)  Wt 77.565 kg (171 lb)  BMI 27.60 kg/m2  SpO2 97%      FHT:  FHR: 150 bpm, variability: moderate,  accelerations:  Present,  decelerations:  Absent UC:   regular, every 3 minutes SVE:   Dilation: 4 Effacement (%): 80 Station: 0 Exam by:: [employer], RN  Labs: Lab Results  Component Value Date   WBC 11.5* 03/28/2011   HGB 11.5* 03/28/2011   HCT 32.9* 03/28/2011   MCV 90.6 03/28/2011   PLT 255 03/28/2011    Assessment / Plan: Spontaneous labor, progressing normally  Labor: Progressing normally Preeclampsia:  n/a Fetal Wellbeing:  Category I Pain Control:  Labor support without medications I/D:  n/a Anticipated MOD:  NSVD  Kathleen Logan A 03/29/2011, 1:14 AM

## 2011-03-29 NOTE — Anesthesia Preprocedure Evaluation (Addendum)
Anesthesia Evaluation  Patient identified by MRN, date of birth, ID band Patient awake    Reviewed: Allergy & Precautions, H&P , NPO status , Patient's Chart, lab work & pertinent test results  Airway Mallampati: IV TM Distance: >3 FB Neck ROM: Full  Mouth opening: Limited Mouth Opening  Dental No notable dental hx. (+) Teeth Intact   Pulmonary neg pulmonary ROS,  clear to auscultation  Pulmonary exam normal       Cardiovascular neg cardio ROS Regular Normal    Neuro/Psych Negative Neurological ROS  Negative Psych ROS   GI/Hepatic negative GI ROS, Neg liver ROS,   Endo/Other  Negative Endocrine ROS  Renal/GU negative Renal ROS  Genitourinary negative   Musculoskeletal   Abdominal Normal abdominal exam  (+)   Peds  Hematology negative hematology ROS (+)   Anesthesia Other Findings   Reproductive/Obstetrics (+) Pregnancy                          Anesthesia Physical Anesthesia Plan  ASA: II and Emergent  Anesthesia Plan: Spinal   Post-op Pain Management:    Induction:   Airway Management Planned:   Additional Equipment:   Intra-op Plan:   Post-operative Plan:   Informed Consent: I have reviewed the patients History and Physical, chart, labs and discussed the procedure including the risks, benefits and alternatives for the proposed anesthesia with the patient or authorized representative who has indicated his/her understanding and acceptance.     Plan Discussed with: Anesthesiologist, CRNA and Surgeon  Anesthesia Plan Comments:         Anesthesia Quick Evaluation

## 2011-03-29 NOTE — Transfer of Care (Deleted)
Immediate Anesthesia Transfer of Care Note  Patient: Kathleen Logan  Procedure(s) Performed:  CESAREAN SECTION  Patient Location: PACU  Anesthesia Type: General  Level of Consciousness: awake, alert , oriented and patient cooperative  Airway & Oxygen Therapy: Patient Spontanous Breathing and Patient connected to nasal cannula oxygen  Post-op Assessment: Report given to PACU RN, Post -op Vital signs reviewed and stable and Patient moving all extremities  Post vital signs: Reviewed and stable  Complications: No apparent anesthesia complications

## 2011-03-29 NOTE — H&P (Signed)
Kathleen Logan is a 25 y.o. female presenting for leaking of fluid and UC's. Maternal Medical History:  Reason for admission: Reason for admission: rupture of membranes.  Contractions: Onset was 3-5 hours ago.   Frequency: regular.   Duration is approximately 1 minute.   Perceived severity is moderate.    Fetal activity: Perceived fetal activity is normal.   Last perceived fetal movement was within the past hour.    Prenatal complications: no prenatal complications   OB History    Grav Para Term Preterm Abortions TAB SAB Ect Mult Living   1 0 0 0 0 0 0 0 0 0      Past Medical History  Diagnosis Date  . No pertinent past medical history    Past Surgical History  Procedure Date  . No past surgeries    Family History: family history includes Arthritis in her maternal aunt, maternal uncle, paternal aunt, and paternal uncle; Cancer in her maternal aunt and paternal grandfather; Diabetes in her maternal grandmother and paternal grandmother; Hypertension in her maternal grandmother, mother, and paternal grandmother; and Stroke in her maternal aunt. Social History:  reports that she has never smoked. She does not have any smokeless tobacco history on file. She reports that she does not drink alcohol or use illicit drugs.  ROS  Dilation: 4 Effacement (%): 80 Station: 0 Exam by:: Penley, RN Blood pressure 152/99, pulse 82, temperature 98 F (36.7 C), temperature source Oral, resp. rate 24, height 5\' 6"  (1.676 m), weight 77.565 kg (171 lb), SpO2 97.00%. Exam Physical Exam  Prenatal labs: ABO, Rh: --/--/O POS (04/28 0020) Antibody: Negative (06/15 0000) Rubella: Immune (06/15 0000) RPR: NON REACTIVE (12/25 1420)  HBsAg: Negative (06/15 0000)  HIV: Non-reactive (06/13 0000)  GBS: Negative (12/19 0000)  Ultrasound:  Breech presentation. Assessment/Plan: Term pregnancy.  Early labor.  Breech presentation.  Will proceed with C/S delivery.   Meia Emley A 03/29/2011, 1:06  AM

## 2011-03-29 NOTE — Progress Notes (Signed)
MD on his way to the hospital.

## 2011-03-29 NOTE — Transfer of Care (Signed)
Immediate Anesthesia Transfer of Care Note  Patient: Kathleen Logan  Procedure(s) Performed:  CESAREAN SECTION - Primary Cesarean Section Delivery Girl @ [phone], Apgars 8/9  Patient Location: PACU  Anesthesia Type: Spinal  Level of Consciousness: awake, alert , oriented and patient cooperative  Airway & Oxygen Therapy: Patient Spontanous Breathing  Post-op Assessment: Report given to PACU RN and Post -op Vital signs reviewed and stable  Post vital signs: Reviewed and stable  Complications: No apparent anesthesia complications

## 2011-03-29 NOTE — Progress Notes (Signed)
Subjective: Postpartum Day 1: Cesarean Delivery for breech presentation Patient reports incisional pain, tolerating PO and + flatus.  Denies HA, visual disturbances, RUQ pain.   Objective: Vital signs in last 24 hours: Temp:  [97.6 F (36.4 C)-99.7 F (37.6 C)] 99 F (37.2 C) (12/26 1240) Pulse Rate:  [72-97] 87  (12/26 1240) Resp:  [16-24] 20  (12/26 1240) BP: (126-166)/(63-99) 142/83 mmHg (12/26 1240) SpO2:  [95 %-100 %] 98 % (12/26 1240)  Physical Exam:  General: alert, cooperative, appears stated age and no distress Lochia: appropriate Uterine Fundus: firm Incision: no significant drainage, Dressing CDI. DVT Evaluation: No evidence of DVT seen on physical exam. Negative Homan's sign. No cords or calf tenderness. SCDs in place.    Basename 03/28/11 1420  HGB 11.5*  HCT 32.9*    Assessment/Plan: Status post Cesarean section. Doing well postoperatively. Encouraged to ambulate today.  Will consult MD regarding low-grade temps, and labile BPs.  Continue current care.  Logan, Kathleen Kemppainen 03/29/2011, 2:50 PM

## 2011-03-29 NOTE — Progress Notes (Signed)
Clearance Coots, MD, at bedside. Informs patient about a cesarean section. Consents obtained

## 2011-03-29 NOTE — Anesthesia Postprocedure Evaluation (Signed)
  Anesthesia Post-op Note  Patient: Kathleen Logan  Procedure(s) Performed:  CESAREAN SECTION - Primary Cesarean Section Delivery Girl @ [phone], Apgars 8/9  Patient Location: PACU  Anesthesia Type: Spinal  Level of Consciousness: awake, alert  and oriented  Airway and Oxygen Therapy: Patient Spontanous Breathing  Post-op Pain: mild  Post-op Assessment: Post-op Vital signs reviewed, Patient's Cardiovascular Status Stable, Respiratory Function Stable, Patent Airway, No signs of Nausea or vomiting, Pain level controlled, No headache and No backache  Post-op Vital Signs: Reviewed and stable  Complications: No apparent anesthesia complications

## 2011-03-29 NOTE — Anesthesia Postprocedure Evaluation (Signed)
  Anesthesia Post-op Note  Patient: Kathleen Logan  Procedure(s) Performed:  CESAREAN SECTION - Primary Cesarean Section Delivery Girl @ [phone], Apgars 8/9  Patient Location: Mother/Baby  Anesthesia Type: Spinal  Level of Consciousness: awake, alert  and oriented  Airway and Oxygen Therapy: Patient Spontanous Breathing  Post-op Pain: none  Post-op Assessment: Post-op Vital signs reviewed, Patient's Cardiovascular Status Stable, No headache, No backache, No residual numbness and No residual motor weakness  Post-op Vital Signs: Reviewed and stable  Complications: No apparent anesthesia complications

## 2011-03-30 LAB — CBC
Hemoglobin: 8.6 g/dL — ABNORMAL LOW (ref 12.0–15.0)
MCH: 31.4 pg (ref 26.0–34.0)
MCV: 92 fL (ref 78.0–100.0)
RBC: 2.74 MIL/uL — ABNORMAL LOW (ref 3.87–5.11)

## 2011-03-30 NOTE — Progress Notes (Signed)
Patient ID: Kathleen Logan, female   DOB: 1985/09/18, 25 y.o.   MRN: [id number] Postop day 1 Vital signs normal Fundus firm Legs negative No complaints

## 2011-03-31 NOTE — Progress Notes (Signed)
Patient ID: Kathleen Logan, female   DOB: 01/24/1986, 25 y.o.   MRN: [id number] Postoperative day #2 Vital signs normal Fundus firm Legs negative No complaints and and

## 2011-04-01 ENCOUNTER — Encounter (HOSPITAL_COMMUNITY): Payer: Self-pay | Admitting: Obstetrics

## 2011-04-01 NOTE — Discharge Summary (Signed)
Obstetric Discharge Summary Reason for Admission: onset of labor Prenatal Procedures: none Intrapartum Procedures: cesarean: low cervical, transverse Postpartum Procedures: none Complications-Operative and Postpartum: none Hemoglobin  Date Value Range Status  03/30/2011 8.6* 12.0-15.0 (g/dL) Final     DELTA CHECK NOTED     REPEATED TO VERIFY     HCT  Date Value Range Status  03/30/2011 25.2* 36.0-46.0 (%) Final    Discharge Diagnoses: Term Pregnancy-delivered  Discharge Information: Date: 04/01/2011 Activity: pelvic rest Diet: routine Medications: Percocet Condition: stable Instructions: refer to practice specific booklet Discharge to: home Follow-up Information    Follow up with Shelly Spenser A, MD. Call in 6 weeks.   Contact information:   [address] [place] [address] [phone]          Newborn Data: Live born female  Birth Weight: 7 lb 2.8 oz (3255 g) APGAR: 8, 9  Home with mother.  Andriana Casa A 04/01/2011, 4:20 AM

## 2011-04-19 ENCOUNTER — Telehealth (HOSPITAL_COMMUNITY): Payer: Self-pay | Admitting: *Deleted

## 2011-10-30 ENCOUNTER — Emergency Department (HOSPITAL_COMMUNITY)
Admission: EM | Admit: 2011-10-30 | Discharge: 2011-10-30 | Disposition: A | Discharge status: Home / Self Care | Payer: 59 | Attending: Emergency Medicine | Admitting: Emergency Medicine

## 2011-10-30 ENCOUNTER — Encounter (HOSPITAL_COMMUNITY): Payer: Self-pay | Admitting: *Deleted

## 2011-10-30 DIAGNOSIS — S39012A Strain of muscle, fascia and tendon of lower back, initial encounter: Secondary | ICD-10-CM

## 2011-10-30 DIAGNOSIS — S335XXA Sprain of ligaments of lumbar spine, initial encounter: Secondary | ICD-10-CM | POA: Insufficient documentation

## 2011-10-30 MED ORDER — DIAZEPAM 5 MG PO TABS: ORAL_TABLET | ORAL | Status: AC

## 2011-10-30 MED ORDER — IBUPROFEN 400 MG PO TABS
800.0000 mg | ORAL_TABLET | Freq: Once | ORAL | Status: AC
  Administered 2011-10-30: 800 mg via ORAL
  Filled 2011-10-30: qty 2

## 2011-10-30 MED ORDER — ACETAMINOPHEN-CODEINE #3 300-30 MG PO TABS: 1.0000 | ORAL_TABLET | Freq: Four times a day (QID) | ORAL | Status: AC | PRN

## 2011-10-30 MED ORDER — IBUPROFEN 800 MG PO TABS: 800.0000 mg | ORAL_TABLET | Freq: Three times a day (TID) | ORAL | Status: AC
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
## 2011-10-31 NOTE — ED Provider Notes (Signed)
Medical screening examination/treatment/procedure(s) were performed by non-physician practitioner and as supervising physician I was immediately available for consultation/collaboration.   Carleene Cooper III, MD 10/31/11 [phone]

## 2012-02-27 ENCOUNTER — Emergency Department (INDEPENDENT_AMBULATORY_CARE_PROVIDER_SITE_OTHER)
Admission: EM | Admit: 2012-02-27 | Discharge: 2012-02-27 | Disposition: A | Discharge status: Home / Self Care | Payer: Self-pay | Source: Home / Self Care

## 2012-02-27 ENCOUNTER — Encounter (HOSPITAL_COMMUNITY): Payer: Self-pay | Admitting: *Deleted

## 2012-02-27 DIAGNOSIS — J069 Acute upper respiratory infection, unspecified: Secondary | ICD-10-CM

## 2012-02-27 DIAGNOSIS — J040 Acute laryngitis: Secondary | ICD-10-CM

## 2012-02-27 NOTE — ED Notes (Signed)
Pt  Reports  Symptoms  Of  sorethroat  With  Hoarseness   X  2  Days   Child   Has  Symptoms  Of a  [place] Peers

## 2012-02-27 NOTE — ED Provider Notes (Signed)
History     CSN: [id number]  Arrival date & time 02/27/12  1547   None     Chief Complaint  Patient presents with  . Sore Throat    (Consider location/radiation/quality/duration/timing/severity/associated sxs/prior treatment) HPI Comments: 26 year old female presents with nasal sniffles, cough, headache, sore throat, watery eyes and laryngitis. This developed past 3-4 days. He been taking various OTC cold meds without complete relief of symptoms. She denies having fever chills, vomiting or other problems.   Past Medical History  Diagnosis Date  . No pertinent past medical history     Past Surgical History  Procedure Date  . No past surgeries   . Cesarean section 03/29/2011    Procedure: CESAREAN SECTION;  Surgeon: Brock Bad, MD;  Location: [facility];  Service: Gynecology;  Laterality: N/A;  Primary Cesarean Section Delivery Girl @ 0223, Apgars 8/9    Family History  Problem Relation Age of Onset  . Hypertension Mother   . Stroke Maternal Aunt   . Arthritis Maternal Aunt   . Cancer Maternal Aunt   . Arthritis Maternal Uncle   . Arthritis Paternal Aunt   . Arthritis Paternal Uncle   . Diabetes Maternal Grandmother   . Hypertension Maternal Grandmother   . Diabetes Paternal Grandmother   . Hypertension Paternal Grandmother   . Cancer Paternal Grandfather     History  Substance Use Topics  . Smoking status: Never Smoker   . Smokeless tobacco: Not on file  . Alcohol Use: No    OB History    Grav Para Term Preterm Abortions TAB SAB Ect Mult Living   1 1 1  0 0 0 0 0 0 1      Review of Systems  Constitutional: Negative for fever, chills, activity change, appetite change and fatigue.  HENT: Positive for congestion, rhinorrhea, voice change and postnasal drip. Negative for facial swelling, neck pain and neck stiffness.   Eyes: Negative.   Respiratory: Negative.   Cardiovascular: Negative.   Gastrointestinal: Negative.   Skin: Negative for pallor and rash.    Neurological: Negative.   Psychiatric/Behavioral: Negative.     Allergies  Review of patient's allergies indicates no known allergies.  Home Medications   Current Outpatient Rx  Name  Route  Sig  Dispense  Refill  . ALBUTEROL IN   Inhalation   Inhale into the lungs.         . ACETAMINOPHEN-CODEINE #3 300-30 MG PO TABS   Oral   Take 1 tablet by mouth every 4 (four) hours as needed. For pain         . PRENATAL MULTIVITAMIN CH   Oral   Take 1 tablet by mouth daily.           BP 146/83  Pulse 104  Temp 98.9 F (37.2 C) (Oral)  Resp 20  SpO2 99%  Physical Exam  Nursing note and vitals reviewed. Constitutional: She is oriented to person, place, and time. She appears well-developed and well-nourished. No distress.  HENT:       Bilateral TMs are pearly gray, translucent, no effusion, retractions or bulging. Oropharynx with minor erythema and cobblestoning. Clear PND  Eyes: Conjunctivae normal and EOM are normal.  Neck: Normal range of motion. Neck supple.  Cardiovascular: Normal rate, regular rhythm and normal heart sounds.   Pulmonary/Chest: Effort normal and breath sounds normal. No respiratory distress.  Musculoskeletal: Normal range of motion. She exhibits no edema.  Lymphadenopathy:    She has no cervical adenopathy.  Neurological: She is alert and oriented to person, place, and time.  Skin: Skin is warm and dry. No rash noted.  Psychiatric: She has a normal mood and affect.    ED Course  Procedures (including critical care time)  Labs Reviewed - No data to display No results found.   1. URI (upper respiratory infection)   2. Acute laryngitis       MDM  Physical exam is remarkable for uncomplicated URI symptoms. To take Tylenol every 4 hours and an over-the-counter medication that contains a decongestant, an antihistamine and cough medicine such as dextromethorphan and Robitussin. Drink plenty of fluids and stay well hydrated  Cepacol lozenges  as needed for sore throat         Hayden Rasmussen, NP 02/27/12 [phone]

## 2012-02-27 NOTE — ED Provider Notes (Signed)
Medical screening examination/treatment/procedure(s) were performed by resident physician or non-physician practitioner and as supervising physician I was immediately available for consultation/collaboration.   Ulis Kaps DOUGLAS MD.    Freyja Govea D Akua Blethen, MD 02/27/12 1656 

## 2012-05-31 ENCOUNTER — Encounter (HOSPITAL_COMMUNITY): Payer: Self-pay

## 2012-05-31 ENCOUNTER — Emergency Department (HOSPITAL_COMMUNITY)
Admission: EM | Admit: 2012-05-31 | Discharge: 2012-05-31 | Disposition: A | Discharge status: Home / Self Care | Payer: BC Managed Care – PPO | Attending: Emergency Medicine | Admitting: Emergency Medicine

## 2012-05-31 ENCOUNTER — Emergency Department (HOSPITAL_COMMUNITY): Payer: BC Managed Care – PPO

## 2012-05-31 DIAGNOSIS — Z3202 Encounter for pregnancy test, result negative: Secondary | ICD-10-CM | POA: Insufficient documentation

## 2012-05-31 DIAGNOSIS — R109 Unspecified abdominal pain: Secondary | ICD-10-CM

## 2012-05-31 DIAGNOSIS — R1084 Generalized abdominal pain: Secondary | ICD-10-CM | POA: Insufficient documentation

## 2012-05-31 DIAGNOSIS — Z8719 Personal history of other diseases of the digestive system: Secondary | ICD-10-CM | POA: Insufficient documentation

## 2012-05-31 DIAGNOSIS — R112 Nausea with vomiting, unspecified: Secondary | ICD-10-CM | POA: Insufficient documentation

## 2012-05-31 DIAGNOSIS — R197 Diarrhea, unspecified: Secondary | ICD-10-CM | POA: Insufficient documentation

## 2012-05-31 HISTORY — DX: Irritable bowel syndrome, unspecified: K58.9

## 2012-05-31 LAB — COMPREHENSIVE METABOLIC PANEL
ALT: 10 U/L (ref 0–35)
AST: 15 U/L (ref 0–37)
Alkaline Phosphatase: 71 U/L (ref 39–117)
CO2: 23 mEq/L (ref 19–32)
Calcium: 9.1 mg/dL (ref 8.4–10.5)
Chloride: 104 mEq/L (ref 96–112)
GFR calc non Af Amer: >90 mL/min (ref >90)
Potassium: 3.6 mEq/L (ref 3.5–5.1)
Sodium: 137 mEq/L (ref 135–145)
Total Bilirubin: 0.6 mg/dL (ref 0.3–1.2)

## 2012-05-31 LAB — CBC WITH DIFFERENTIAL/PLATELET
Basophils Absolute: 0 10*3/uL (ref 0.0–0.1)
Lymphocytes Relative: 20 % (ref 12–46)
Lymphs Abs: 1.1 10*3/uL (ref 0.7–4.0)
Neutro Abs: 4 10*3/uL (ref 1.7–7.7)
Neutrophils Relative %: 70 % (ref 43–77)
Platelets: 267 10*3/uL (ref 150–400)
RBC: 4.43 MIL/uL (ref 3.87–5.11)
RDW: 12.5 % (ref 11.5–15.5)
WBC: 5.8 10*3/uL (ref 4.0–10.5)

## 2012-05-31 LAB — URINALYSIS, MICROSCOPIC ONLY
Bilirubin Urine: NEGATIVE
Glucose, UA: NEGATIVE mg/dL
Protein, ur: 30 mg/dL — AB

## 2012-05-31 MED ORDER — ONDANSETRON HCL 4 MG/2ML IJ SOLN
4.0000 mg | Freq: Once | INTRAMUSCULAR | Status: AC
  Administered 2012-05-31: 4 mg via INTRAVENOUS
  Filled 2012-05-31: qty 2

## 2012-05-31 MED ORDER — SODIUM CHLORIDE 0.9 % IV SOLN
1000.0000 mL | Freq: Once | INTRAVENOUS | Status: AC
  Administered 2012-05-31: 1000 mL via INTRAVENOUS

## 2012-05-31 MED ORDER — HYDROCODONE-ACETAMINOPHEN 5-325 MG PO TABS: 1.0000 | ORAL_TABLET | Freq: Three times a day (TID) | ORAL | Status: DC | PRN

## 2012-05-31 MED ORDER — HYDROMORPHONE HCL PF 1 MG/ML IJ SOLN
1.0000 mg | Freq: Once | INTRAMUSCULAR | Status: AC
  Administered 2012-05-31: 1 mg via INTRAVENOUS
  Filled 2012-05-31: qty 1

## 2012-05-31 MED ORDER — METOCLOPRAMIDE HCL 10 MG PO TABS: 10.0000 mg | ORAL_TABLET | Freq: Four times a day (QID) | ORAL | Status: DC | PRN

## 2012-05-31 NOTE — ED Provider Notes (Signed)
History     CSN: [id number]  Arrival date & time 05/31/12  1047   First MD Initiated Contact with Patient 05/31/12 1222      Chief Complaint  Patient presents with  . Abdominal Pain  . Nausea  . Emesis  . Diarrhea    (Consider location/radiation/quality/duration/timing/severity/associated sxs/prior treatment) HPI The patient presents with nausea, vomiting, diarrhea, abdominal pain.  Symptoms began approximately 4 days ago, and over the interval have become severe.  Just complains of anorexia.  No fever, no confusion, no disorientation, no chest pain, no dyspnea. Over this time frame she has had innumerable episodes of vomiting/diarrhea. She notes that she has a history of IBS, though no flares in years. She denies a history of abdominal surgery.  Past Medical History  Diagnosis Date  . No pertinent past medical history   . IBS (irritable bowel syndrome)     Past Surgical History  Procedure Laterality Date  . No past surgeries    . Cesarean section  03/29/2011    Procedure: CESAREAN SECTION;  Surgeon: Brock Bad, MD;  Location: [facility];  Service: Gynecology;  Laterality: N/A;  Primary Cesarean Section Delivery Girl @ 0223, Apgars 8/9    Family History  Problem Relation Age of Onset  . Hypertension Mother   . Stroke Maternal Aunt   . Arthritis Maternal Aunt   . Cancer Maternal Aunt   . Arthritis Maternal Uncle   . Arthritis Paternal Aunt   . Arthritis Paternal Uncle   . Diabetes Maternal Grandmother   . Hypertension Maternal Grandmother   . Diabetes Paternal Grandmother   . Hypertension Paternal Grandmother   . Cancer Paternal Grandfather     History  Substance Use Topics  . Smoking status: Never Smoker   . Smokeless tobacco: Not on file  . Alcohol Use: No    OB History   Grav Para Term Preterm Abortions TAB SAB Ect Mult Living   1 1 1  0 0 0 0 0 0 1      Review of Systems  Constitutional:       Per HPI, otherwise negative  HENT:       Per HPI,  otherwise negative  Respiratory:       Per HPI, otherwise negative  Cardiovascular:       Per HPI, otherwise negative  Gastrointestinal: Positive for nausea, vomiting, abdominal pain and diarrhea.  Endocrine:       Negative aside from HPI  Genitourinary:       Neg aside from HPI   Musculoskeletal:       Per HPI, otherwise negative  Skin: Negative.   Neurological: Negative for syncope.    Allergies  Review of patient's allergies indicates no known allergies.  Home Medications   Current Outpatient Rx  Name  Route  Sig  Dispense  Refill  . albuterol (PROVENTIL HFA;VENTOLIN HFA) 108 (90 BASE) MCG/ACT inhaler   Inhalation   Inhale 2 puffs into the lungs every 6 (six) hours as needed for wheezing or shortness of breath.         [name] ibuprofen (ADVIL,MOTRIN) 800 MG tablet   Oral   Take 800 mg by mouth every 8 (eight) hours as needed for pain.           BP 110/59  Pulse 87  Temp(Src) 98 F (36.7 C) (Oral)  Resp 16  SpO2 94%  Breastfeeding? Yes  Physical Exam  Nursing note and vitals reviewed. Constitutional: She is oriented to person,  place, and time. She appears well-developed and well-nourished. No distress.  HENT:  Head: Normocephalic and atraumatic.  Eyes: Conjunctivae and EOM are normal.  Cardiovascular: Regular rhythm.  Tachycardia present.   Pulmonary/Chest: Effort normal and breath sounds normal. No stridor. No respiratory distress.  Abdominal: She exhibits no distension. There is generalized tenderness. There is guarding. There is no rigidity and no rebound.  Musculoskeletal: She exhibits no edema.  Neurological: She is alert and oriented to person, place, and time. No cranial nerve deficit.  Skin: Skin is warm and dry.  Psychiatric: She has a normal mood and affect.    ED Course  Procedures (including critical care time)  Labs Reviewed  CBC WITH DIFFERENTIAL - Abnormal; Notable for the following:    MCHC 36.4 (*)    All other components within normal  limits  URINALYSIS, MICROSCOPIC ONLY - Abnormal; Notable for the following:    Hgb urine dipstick SMALL (*)    Protein, ur 30 (*)    Leukocytes, UA SMALL (*)    Squamous Epithelial / LPF FEW (*)    All other components within normal limits  COMPREHENSIVE METABOLIC PANEL  LIPASE, BLOOD  POCT PREGNANCY, URINE   Dg Abd Acute W/chest  05/31/2012  *RADIOLOGY REPORT*  Clinical Data: Abdominal pain and nausea.  ACUTE ABDOMEN SERIES (ABDOMEN 2 VIEW & CHEST 1 VIEW)  Comparison: None.  Findings: The heart size is normal.  The lungs are clear.  The visualized soft tissues and bony thorax are unremarkable.  Supine and kidneys views of the abdomen demonstrate nonspecific bowel gas pattern.  There is no evidence for obstruction or free air.  IMPRESSION: No acute abnormality of the chest or abdomen.   Original Report Authenticated By: Marin Roberts, M.D.      No diagnosis found.  Pulse ox 99% room air normal  2:18 PM Patient feeling much better.  She remains tachycardic  4:07 PM The patient appears better, states that she feels better.  No longer tachycardic.  MDM  This patient with history of IBS now presents with ongoing abdominal pain, nausea, vomiting, diarrhea.  Following IV fluids, analgesics, antiemetics the patient was substantially improved.  The patient did have a tender abdomen, though it was soft, and only generally uncomfortable, with no Murphy sign, no pain at McBurney's point.  Given her diagnosis of IBS, there suspicion for flare rather than acute appendicitis.  We discussed return precautions, and the patient was discharged to follow up with her gastroenterologist.    Gerhard Munch, MD 05/31/12 [phone]

## 2012-05-31 NOTE — ED Notes (Signed)
Pt transported to radiology.

## 2012-05-31 NOTE — ED Notes (Signed)
Pt c/o (L) side abd pain, N/V/D, and decreased appetitie starting Sunday. Pt reports a hx of IBS but never last this long, pt has vomited x4 and loose stool x9 in the last 24 hours.

## 2012-06-04 ENCOUNTER — Emergency Department (HOSPITAL_COMMUNITY)
Admission: EM | Admit: 2012-06-04 | Discharge: 2012-06-04 | Disposition: A | Discharge status: Home / Self Care | Payer: BC Managed Care – PPO | Attending: Emergency Medicine | Admitting: Emergency Medicine

## 2012-06-04 ENCOUNTER — Encounter (HOSPITAL_COMMUNITY): Payer: Self-pay | Admitting: *Deleted

## 2012-06-04 ENCOUNTER — Emergency Department (HOSPITAL_COMMUNITY): Payer: BC Managed Care – PPO

## 2012-06-04 DIAGNOSIS — Y92410 Unspecified street and highway as the place of occurrence of the external cause: Secondary | ICD-10-CM | POA: Insufficient documentation

## 2012-06-04 DIAGNOSIS — S335XXA Sprain of ligaments of lumbar spine, initial encounter: Secondary | ICD-10-CM | POA: Insufficient documentation

## 2012-06-04 DIAGNOSIS — Z8719 Personal history of other diseases of the digestive system: Secondary | ICD-10-CM | POA: Insufficient documentation

## 2012-06-04 DIAGNOSIS — Y9389 Activity, other specified: Secondary | ICD-10-CM | POA: Insufficient documentation

## 2012-06-04 DIAGNOSIS — S39012A Strain of muscle, fascia and tendon of lower back, initial encounter: Secondary | ICD-10-CM

## 2012-06-04 DIAGNOSIS — Z79899 Other long term (current) drug therapy: Secondary | ICD-10-CM | POA: Insufficient documentation

## 2012-06-04 MED ORDER — ACETAMINOPHEN 325 MG PO TABS
650.0000 mg | ORAL_TABLET | Freq: Once | ORAL | Status: AC
  Administered 2012-06-04: 650 mg via ORAL
  Filled 2012-06-04: qty 2
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
## 2012-06-04 NOTE — ED Provider Notes (Signed)
Medical screening examination/treatment/procedure(s) were performed by non-physician practitioner and as supervising physician I was immediately available for consultation/collaboration.  Ankit Nanavati, MD 06/04/12 1506 

## 2013-02-12 ENCOUNTER — Ambulatory Visit (INDEPENDENT_AMBULATORY_CARE_PROVIDER_SITE_OTHER): Payer: BC Managed Care – PPO | Admitting: Family Medicine

## 2013-02-12 ENCOUNTER — Ambulatory Visit: Payer: BC Managed Care – PPO

## 2013-02-12 VITALS — BP 122/76 | HR 86 | Temp 98.6°F | Resp 17 | Ht 67.5 in | Wt 132.0 lb

## 2013-02-12 DIAGNOSIS — R112 Nausea with vomiting, unspecified: Secondary | ICD-10-CM

## 2013-02-12 DIAGNOSIS — R197 Diarrhea, unspecified: Secondary | ICD-10-CM

## 2013-02-12 DIAGNOSIS — R109 Unspecified abdominal pain: Secondary | ICD-10-CM

## 2013-02-12 DIAGNOSIS — K589 Irritable bowel syndrome without diarrhea: Secondary | ICD-10-CM

## 2013-02-12 DIAGNOSIS — E860 Dehydration: Secondary | ICD-10-CM

## 2013-02-12 LAB — POCT URINALYSIS DIPSTICK
Bilirubin, UA: NEGATIVE
Glucose, UA: NEGATIVE
Ketones, UA: NEGATIVE
Leukocytes, UA: NEGATIVE
Nitrite, UA: NEGATIVE
Protein, UA: 30
Spec Grav, UA: 1.015
Urobilinogen, UA: 0.2
pH, UA: >=9

## 2013-02-12 LAB — POCT CBC
Granulocyte percent: 52.5 %G (ref 37–80)
HCT, POC: 46.3 % (ref 37.7–47.9)
Hemoglobin: 14.5 g/dL (ref 12.2–16.2)
Lymph, poc: 2.8 (ref 0.6–3.4)
MCH, POC: 31 pg (ref 27–31.2)
MCHC: 31.3 g/dL — AB (ref 31.8–35.4)
MCV: 99 fL — AB (ref 80–97)
MID (cbc): 0.3 (ref 0–0.9)
MPV: 8.2 fL (ref 0–99.8)
POC Granulocyte: 3.4 (ref 2–6.9)
POC LYMPH PERCENT: 43.1 % (ref 10–50)
POC MID %: 4.4 %M (ref 0–12)
Platelet Count, POC: 336 10*3/uL (ref 142–424)
RBC: 4.68 M/uL (ref 4.04–5.48)
RDW, POC: 12.8 %
WBC: 6.4 10*3/uL (ref 4.6–10.2)

## 2013-02-12 LAB — POCT UA - MICROSCOPIC ONLY
Bacteria, U Microscopic: NEGATIVE
Casts, Ur, LPF, POC: NEGATIVE
Crystals, Ur, HPF, POC: NEGATIVE
Mucus, UA: NEGATIVE
Yeast, UA: NEGATIVE

## 2013-02-12 LAB — COMPREHENSIVE METABOLIC PANEL WITH GFR
ALT: 10 U/L (ref 0–35)
BUN: 7 mg/dL (ref 6–23)
CO2: 26 meq/L (ref 19–32)
Calcium: 9.8 mg/dL (ref 8.4–10.5)
Chloride: 104 meq/L (ref 96–112)
Creat: 0.69 mg/dL (ref 0.50–1.10)
Total Bilirubin: 0.6 mg/dL (ref 0.3–1.2)

## 2013-02-12 LAB — COMPREHENSIVE METABOLIC PANEL
AST: 15 U/L (ref 0–37)
Albumin: 4.3 g/dL (ref 3.5–5.2)
Alkaline Phosphatase: 52 U/L (ref 39–117)
Glucose, Bld: 87 mg/dL (ref 70–99)
Potassium: 4.4 mEq/L (ref 3.5–5.3)
Sodium: 137 mEq/L (ref 135–145)
Total Protein: 7.4 g/dL (ref 6.0–8.3)

## 2013-02-12 LAB — LIPASE: Lipase: <10 U/L (ref 0–75)

## 2013-02-12 MED ORDER — HYOSCYAMINE 0.15 MG PO TABS: 0.1500 mg | ORAL_TABLET | ORAL | Status: DC | PRN

## 2013-02-12 MED ORDER — KETOROLAC TROMETHAMINE 60 MG/2ML IM SOLN
60.0000 mg | Freq: Once | INTRAMUSCULAR | Status: AC
  Administered 2013-02-12: 60 mg via INTRAMUSCULAR

## 2013-02-12 MED ORDER — ONDANSETRON 4 MG PO TBDP
4.0000 mg | ORAL_TABLET | Freq: Once | ORAL | Status: AC
  Administered 2013-02-12: 4 mg via ORAL

## 2013-02-12 MED ORDER — ONDANSETRON 4 MG PO TBDP: 4.0000 mg | ORAL_TABLET | Freq: Three times a day (TID) | ORAL | Status: DC | PRN

## 2013-02-12 NOTE — Patient Instructions (Signed)
Abdominal Pain °Many things can cause belly (abdominal) pain. Most times, the belly pain is not dangerous. The amount of belly pain does not tell how serious the problem may be. Many cases of belly pain can be watched and treated at home. °HOME CARE  °· Do not take medicines that help you go poop (laxatives) unless told to by your doctor. °· Only take medicine as told by your doctor. °· Eat or drink as told by your doctor. Your doctor will tell you if you should be on a special diet. °GET HELP RIGHT AWAY IF:  °· The pain does not go away. °· You have a fever. °· You keep throwing up (vomiting). °· The pain changes and is only in the right or left part of the belly. °· You have bloody or tarry looking poop. °MAKE SURE YOU:  °· Understand these instructions. °· Will watch your condition. °· Will get help right away if you are not doing well or get worse. °Document Released: 09/06/2007 Document Revised: 06/12/2011 Document Reviewed: 04/05/2009 °[employer]® Patient Information ©2014 [employer]. ° °

## 2013-02-12 NOTE — Progress Notes (Signed)
[facility]:  Office Visit  Chief Complaint:  Chief Complaint  Patient presents with  . Abdominal Pain  . Diarrhea    HPI: Kathleen Logan is a 27 y.o. female who is here for 3 day history of nonbloody diarrhea, feels like she is "hot " on the inside, she has a hard time breathing. She has diffuse abd pain, 20/10 pain on pain scale, sharp , intermittent, constant nauseated feeling, knots in her stomach and sharp pain. Associated with vomitus.  She has taken tylenol and ibuprofen. She used to be on vicodin for IBS but she is out of those, she was given this on her prior trips to the ER for simialr sxs. Her gastroenterologist is Dr Elnoria Howard, he along with her prior PCP had dx her with IBS, she was scheduled to get a colonoscopy but that was canceled for some reason. She has not been to see him in over 1 year.  She has not taken any new meds, no new foods, no new travels, No sick contacts, She works at [employer], She has a daughter in daycare who is fine, No abd surgeries except for c sxn She vomitted up liquid She is supposed to have colposcopy and bx with ob/gy, she denies any urianry sxs, denies any vaginal dc She took some advil, was on her period 1 week ago Denies h/o etoh abuse, gallstones, renal stones She has had a lot of stressors in her life currently her aunt just passed away from liver cirrhosis, her grandmother is in the hospital and she just found out she has abnormal cells on her pap and needs to go in for a colposcopy  Past Medical History  Diagnosis Date  . No pertinent past medical history   . IBS (irritable bowel syndrome)    Past Surgical History  Procedure Laterality Date  . No past surgeries    . Cesarean section  03/29/2011    Procedure: CESAREAN SECTION;  Surgeon: Brock Bad, MD;  Location: [facility];  Service: Gynecology;  Laterality: N/A;  Primary Cesarean Section Delivery Girl @ 0223, Apgars 8/9   History   Social History  . Marital Status:  Single    Spouse Name: N/A    Number of Children: N/A  . Years of Education: N/A   Social History Main Topics  . Smoking status: Never Smoker   . Smokeless tobacco: None  . Alcohol Use: No  . Drug Use: No  . Sexual Activity: Yes    Birth Control/ Protection: Injection   Other Topics Concern  . None   Social History Narrative  . None   Family History  Problem Relation Age of Onset  . Hypertension Mother   . Stroke Maternal Aunt   . Arthritis Maternal Aunt   . Cancer Maternal Aunt   . Arthritis Maternal Uncle   . Arthritis Paternal Aunt   . Arthritis Paternal Uncle   . Diabetes Maternal Grandmother   . Hypertension Maternal Grandmother   . Diabetes Paternal Grandmother   . Hypertension Paternal Grandmother   . Cancer Paternal Grandfather    No Known Allergies Prior to Admission medications   Medication Sig Start Date End Date Taking? Authorizing Provider  albuterol (PROVENTIL HFA;VENTOLIN HFA) 108 (90 BASE) MCG/ACT inhaler Inhale 2 puffs into the lungs every 6 (six) hours as needed for wheezing or shortness of breath.   Yes Historical Provider, MD  HYDROcodone-acetaminophen (NORCO/VICODIN) 5-325 MG per tablet Take 1 tablet by mouth every 8 (  eight) hours as needed for pain. 05/31/12   Gerhard Munch, MD  metoCLOPramide (REGLAN) 10 MG tablet Take 1 tablet (10 mg total) by mouth every 6 (six) hours as needed (nausea). 05/31/12   Gerhard Munch, MD     ROS: The patient denies fevers, chills, night sweats, unintentional weight loss, chest pain, palpitations, wheezing, dyspnea on exertion, dysuria, hematuria, melena, numbness, or tingling.   All other systems have been reviewed and were otherwise negative with the exception of those mentioned in the HPI and as above.    PHYSICAL EXAM: Filed Vitals:   02/12/13 1208  BP: 122/76  Pulse: 86  Temp: 98.6 F (37 C)  Resp: 17   Filed Vitals:   02/12/13 1208  Height: 5' 7.5" (1.715 m)  Weight: 132 lb (59.875 kg)  Spo2  100% Body mass index is 20.36 kg/(m^2).  General: Alert, no acute distress HEENT:  Normocephalic, atraumatic, oropharynx patent. EOMI, PERRLA, mucosa nl, TM nl, fundoscopic exam nl Cardiovascular:  Regular rate and rhythm, no rubs murmurs or gallops.  No Carotid bruits, radial pulse intact. No pedal edema.  Respiratory: Clear to auscultation bilaterally.  No wheezes, rales, or rhonchi.  No cyanosis, no use of accessory musculature GI: No organomegaly, abdomen is soft and non-tender, positive bowel sounds.  No masses. Skin: No rashes. Neurologic: Facial musculature symmetric. Psychiatric: Patient is appropriate throughout our interaction. Lymphatic: No cervical lymphadenopathy Musculoskeletal: Gait intact.   LABS: Results for orders placed in visit on 02/12/13  POCT CBC      Result Value Range   WBC 6.4  4.6 - 10.2 K/uL   Lymph, poc 2.8  0.6 - 3.4   POC LYMPH PERCENT 43.1  10 - 50 %L   MID (cbc) 0.3  0 - 0.9   POC MID % 4.4  0 - 12 %M   POC Granulocyte 3.4  2 - 6.9   Granulocyte percent 52.5  37 - 80 %G   RBC 4.68  4.04 - 5.48 M/uL   Hemoglobin 14.5  12.2 - 16.2 g/dL   HCT, POC 03.4  74.2 - 47.9 %   MCV 99.0 (*) 80 - 97 fL   MCH, POC 31.0  27 - 31.2 pg   MCHC 31.3 (*) 31.8 - 35.4 g/dL   RDW, POC 59.5     Platelet Count, POC 336  142 - 424 K/uL   MPV 8.2  0 - 99.8 fL  POCT UA - MICROSCOPIC ONLY      Result Value Range   WBC, Ur, HPF, POC 0-1     RBC, urine, microscopic 2-5     Bacteria, U Microscopic neg     Mucus, UA neg     Epithelial cells, urine per micros 0-2     Crystals, Ur, HPF, POC neg     Casts, Ur, LPF, POC neg     Yeast, UA neg    POCT URINALYSIS DIPSTICK      Result Value Range   Color, UA yellow     Clarity, UA clear     Glucose, UA neg     Bilirubin, UA neg     Ketones, UA neg     Spec Grav, UA 1.015     Blood, UA tr-intact     pH, UA >=9.0     Protein, UA 30     Urobilinogen, UA 0.2     Nitrite, UA neg     Leukocytes, UA Negative  EKG/XRAY:   Primary read interpreted by Dr. Conley Rolls at [facility]. No acute abd process, no obstruction, no free air, no obvious renal stones  ASSESSMENT/PLAN: Encounter Diagnoses  Name Primary?  . Abdominal pain, unspecified site Yes  . N&V (nausea and vomiting)   . Diarrhea   . IBS (irritable bowel syndrome)    ? IBS flare up She felt better after getting toradol 60 mg IM X 1, zofran 4 mg odt  and also 2 bags of IVF Rx Hyoscyamine and Zofran ODT Refer to GI again for IBS workup Stool cx and OP pending, she will bring in tomorrow Work note given F/u prn or ER prn Gross sideeffects, risk and benefits, and alternatives of medications d/w patient. Patient is aware that all medications have potential sideeffects and we are unable to predict every sideeffect or drug-drug interaction that may occur.  Hamilton Capri PHUONG, DO 02/12/2013 4:03 PM

## 2013-02-25 ENCOUNTER — Other Ambulatory Visit: Payer: Self-pay | Admitting: Obstetrics and Gynecology

## 2014-03-30 ENCOUNTER — Encounter (HOSPITAL_COMMUNITY): Payer: Self-pay | Admitting: *Deleted

## 2014-03-30 ENCOUNTER — Inpatient Hospital Stay (HOSPITAL_COMMUNITY): Payer: BC Managed Care – PPO

## 2014-03-30 ENCOUNTER — Inpatient Hospital Stay (HOSPITAL_COMMUNITY)
Admission: AD | Admit: 2014-03-30 | Discharge: 2014-03-30 | Disposition: A | Discharge status: Home / Self Care | Payer: BC Managed Care – PPO | Source: Ambulatory Visit | Attending: Obstetrics & Gynecology | Admitting: Obstetrics & Gynecology

## 2014-03-30 DIAGNOSIS — Z3201 Encounter for pregnancy test, result positive: Secondary | ICD-10-CM

## 2014-03-30 DIAGNOSIS — M549 Dorsalgia, unspecified: Secondary | ICD-10-CM | POA: Insufficient documentation

## 2014-03-30 DIAGNOSIS — M25519 Pain in unspecified shoulder: Secondary | ICD-10-CM | POA: Insufficient documentation

## 2014-03-30 DIAGNOSIS — M5489 Other dorsalgia: Secondary | ICD-10-CM

## 2014-03-30 DIAGNOSIS — Z3490 Encounter for supervision of normal pregnancy, unspecified, unspecified trimester: Secondary | ICD-10-CM

## 2014-03-30 DIAGNOSIS — R109 Unspecified abdominal pain: Secondary | ICD-10-CM | POA: Insufficient documentation

## 2014-03-30 DIAGNOSIS — Z3200 Encounter for pregnancy test, result unknown: Secondary | ICD-10-CM | POA: Diagnosis present

## 2014-03-30 DIAGNOSIS — R52 Pain, unspecified: Secondary | ICD-10-CM

## 2014-03-30 LAB — CBC
HCT: 42.2 % (ref 36.0–46.0)
Hemoglobin: 14.3 g/dL (ref 12.0–15.0)
MCH: 32.3 pg (ref 26.0–34.0)
MCHC: 33.9 g/dL (ref 30.0–36.0)
MCV: 95.3 fL (ref 78.0–100.0)
PLATELETS: 292 10*3/uL (ref 150–400)
RBC: 4.43 MIL/uL (ref 3.87–5.11)
RDW: 12.6 % (ref 11.5–15.5)
WBC: 8.4 10*3/uL (ref 4.0–10.5)

## 2014-03-30 LAB — URINALYSIS, ROUTINE W REFLEX MICROSCOPIC
Bilirubin Urine: NEGATIVE
Glucose, UA: NEGATIVE mg/dL
Ketones, ur: NEGATIVE mg/dL
LEUKOCYTES UA: NEGATIVE
NITRITE: NEGATIVE
PH: 7 (ref 5.0–8.0)
Protein, ur: NEGATIVE mg/dL
SPECIFIC GRAVITY, URINE: 1.02 (ref 1.005–1.030)
UROBILINOGEN UA: 0.2 mg/dL (ref 0.0–1.0)

## 2014-03-30 LAB — URINE MICROSCOPIC-ADD ON

## 2014-03-30 LAB — WET PREP, GENITAL
Trich, Wet Prep: NONE SEEN
Yeast Wet Prep HPF POC: NONE SEEN

## 2014-03-30 LAB — POCT PREGNANCY, URINE: PREG TEST UR: POSITIVE — AB

## 2014-03-30 LAB — HCG, QUANTITATIVE, PREGNANCY: HCG, BETA CHAIN, QUANT, S: 916 m[IU]/mL — AB (ref <5)

## 2014-03-30 NOTE — MAU Provider Note (Signed)
None     Chief Complaint:  Possible Pregnancy   Kathleen Logan is  28 y.o. G2P1001 at 107w6d presents complaining of Possible Pregnancy She had a +UPT at home today and needs verification.  She is having cramping "like I'm going to start my period."  Also c/o upper back and shoulder pain that varies in location, for the past 3 weeks.  Cannot think of any movement/injury.   Obstetrical/Gynecological History: OB History    Gravida Para Term Preterm AB TAB SAB Ectopic Multiple Living   2 1 1  0 0 0 0 0 0 1     Past Medical History: Past Medical History  Diagnosis Date  . No pertinent past medical history   . IBS (irritable bowel syndrome)     Past Surgical History: Past Surgical History  Procedure Laterality Date  . No past surgeries    . Cesarean section  03/29/2011    Procedure: CESAREAN SECTION;  Surgeon: Brock Badharles A Harper, MD;  Location: [facility];  Service: Gynecology;  Laterality: N/A;  Primary Cesarean Section Delivery Girl @ 0223, Apgars 8/9    Family History: Family History  Problem Relation Age of Onset  . Hypertension Mother   . Stroke Maternal Aunt   . Arthritis Maternal Aunt   . Cancer Maternal Aunt   . Arthritis Maternal Uncle   . Arthritis Paternal Aunt   . Arthritis Paternal Uncle   . Diabetes Maternal Grandmother   . Hypertension Maternal Grandmother   . Diabetes Paternal Grandmother   . Hypertension Paternal Grandmother   . Cancer Paternal Grandfather     Social History: History  Substance Use Topics  . Smoking status: Never Smoker   . Smokeless tobacco: Not on file  . Alcohol Use: No    Allergies: No Known Allergies  Meds:  Prescriptions prior to admission  Medication Sig Dispense Refill Last Dose  . albuterol (PROVENTIL HFA;VENTOLIN HFA) 108 (90 BASE) MCG/ACT inhaler Inhale 2 puffs into the lungs every 6 (six) hours as needed for wheezing or shortness of breath.   rescue  . HYDROcodone-acetaminophen (NORCO/VICODIN) 5-325 MG per tablet Take  1 tablet by mouth every 8 (eight) hours as needed for pain. 15 tablet 0 more than one month  . hyoscyamine (CYSTOSPAZ) 0.15 MG tablet Take 1 tablet (0.15 mg total) by mouth every 4 (four) hours as needed (pain, abdominal cramping , diarrhea). 30 tablet 0 more than one month  . metoCLOPramide (REGLAN) 10 MG tablet Take 1 tablet (10 mg total) by mouth every 6 (six) hours as needed (nausea). 15 tablet 0 more than one month  . ondansetron (ZOFRAN ODT) 4 MG disintegrating tablet Take 1 tablet (4 mg total) by mouth every 8 (eight) hours as needed for nausea or vomiting. 20 tablet 1 more than one month    Review of Systems   Constitutional: Negative for fever and chills Eyes: Negative for visual disturbances Respiratory: Negative for shortness of breath, dyspnea Cardiovascular: Negative for chest pain or palpitations  Gastrointestinal: Negative for vomiting, diarrhea and constipation Genitourinary: Negative for dysuria and urgency Musculoskeletal: POSITIVE  for back pain, joint pain, myalgias  Neurological: Negative for dizziness and headaches     Physical Exam  Blood pressure 123/61, pulse 74, temperature 99.3 F (37.4 C), temperature source Oral, resp. rate 16, height 5' 5.5" (1.664 m), weight 61.916 kg (136 lb 8 oz), last menstrual period 02/24/2014, SpO2 100 %. GENERAL: Well-developed, well-nourished female in no acute distress.  LUNGS: Clear to auscultation bilaterally.  HEART: Regular rate and rhythm. ABDOMEN: Soft, nontender, nondistended, gravid.  BACK;  Trigger points all along supraspinous ligaments, ending at shoulders.  No neck pain/trigger points EXTREMITIES: Nontender, no edema, 2+ distal pulses. DTR's 2+ PELVIC:  SSE:  Small amount thin white discharge, no odor.  GC/CHL and wet prep collected. No bleeding CERVICAL EXAM: Dilatation 0cm   Effacement 0%     Labs: Results for orders placed or performed during the hospital encounter of 03/30/14 (from the past 24 hour(s))   Urinalysis, Routine w reflex microscopic   Collection Time: 03/30/14  2:35 PM  Result Value Ref Range   Color, Urine YELLOW YELLOW   APPearance CLEAR CLEAR   Specific Gravity, Urine 1.020 1.005 - 1.030   pH 7.0 5.0 - 8.0   Glucose, UA NEGATIVE NEGATIVE mg/dL   Hgb urine dipstick SMALL (A) NEGATIVE   Bilirubin Urine NEGATIVE NEGATIVE   Ketones, ur NEGATIVE NEGATIVE mg/dL   Protein, ur NEGATIVE NEGATIVE mg/dL   Urobilinogen, UA 0.2 0.0 - 1.0 mg/dL   Nitrite NEGATIVE NEGATIVE   Leukocytes, UA NEGATIVE NEGATIVE  Urine microscopic-add on   Collection Time: 03/30/14  2:35 PM  Result Value Ref Range   Squamous Epithelial / LPF FEW (A) RARE   RBC / HPF 3-6 <3 RBC/hpf   Bacteria, UA RARE RARE  Pregnancy, urine POC   Collection Time: 03/30/14  2:39 PM  Result Value Ref Range   Preg Test, Ur POSITIVE (A) NEGATIVE  CBC   Collection Time: 03/30/14  3:53 PM  Result Value Ref Range   WBC 8.4 4.0 - 10.5 K/uL   RBC 4.43 3.87 - 5.11 MIL/uL   Hemoglobin 14.3 12.0 - 15.0 g/dL   HCT 19.142.2 47.836.0 - 29.546.0 %   MCV 95.3 78.0 - 100.0 fL   MCH 32.3 26.0 - 34.0 pg   MCHC 33.9 30.0 - 36.0 g/dL   RDW 62.112.6 30.811.5 - 65.715.5 %   Platelets 292 150 - 400 K/uL  hCG, quantitative, pregnancy   Collection Time: 03/30/14  3:53 PM  Result Value Ref Range   hCG, Beta Chain, Quant, S 916 (H) <5 mIU/mL  Wet prep, genital   Collection Time: 03/30/14  4:40 PM  Result Value Ref Range   Yeast Wet Prep HPF POC NONE SEEN NONE SEEN   Trich, Wet Prep NONE SEEN NONE SEEN   Clue Cells Wet Prep HPF POC MANY (A) NONE SEEN   WBC, Wet Prep HPF POC FEW (A) NONE SEEN   Imaging Studies:  Preliminary results show "probably gestational sac" measuring 4wks.6 days (C/W LMP and QHCG)    Assessment: Kathleen Logan is  28 y.o. G2P1001 at 3212w6d presents with early pregnancy with + intrauterine gestational sac seen.  Not concerning for SAB or ectopic.  + clue cells, but no amine odor or symptoms, so will not provide treatment d/t  early pregnancy  Supraspinous muscle pain:  Back exercises, ice prn  Plan: F/U with OB provider in a week or two  CRESENZO-DISHMAN,Rockne Dearinger 12/28/20155:31 PM

## 2014-03-30 NOTE — Discharge Instructions (Signed)

## 2014-03-30 NOTE — MAU Note (Signed)
+  HPT on 12/27- needs confirmation.  Is cramping real bad, pain in back (past 4 days) and shoulders(past 2-3wks)- real bad.

## 2014-03-31 LAB — GC/CHLAMYDIA PROBE AMP
CT Probe RNA: NEGATIVE
GC Probe RNA: NEGATIVE

## 2014-04-28 ENCOUNTER — Encounter (HOSPITAL_COMMUNITY): Payer: Self-pay | Admitting: *Deleted

## 2014-04-28 ENCOUNTER — Inpatient Hospital Stay (HOSPITAL_COMMUNITY)
Admission: AD | Admit: 2014-04-28 | Discharge: 2014-04-28 | Disposition: A | Discharge status: Home / Self Care | Payer: BLUE CROSS/BLUE SHIELD | Source: Ambulatory Visit | Attending: Obstetrics and Gynecology | Admitting: Obstetrics and Gynecology

## 2014-04-28 DIAGNOSIS — O210 Mild hyperemesis gravidarum: Secondary | ICD-10-CM | POA: Diagnosis present

## 2014-04-28 DIAGNOSIS — O219 Vomiting of pregnancy, unspecified: Secondary | ICD-10-CM | POA: Diagnosis not present

## 2014-04-28 DIAGNOSIS — Z3A09 9 weeks gestation of pregnancy: Secondary | ICD-10-CM | POA: Insufficient documentation

## 2014-04-28 LAB — URINALYSIS, ROUTINE W REFLEX MICROSCOPIC
Bilirubin Urine: NEGATIVE
GLUCOSE, UA: NEGATIVE mg/dL
Leukocytes, UA: NEGATIVE
Nitrite: NEGATIVE
Protein, ur: NEGATIVE mg/dL
UROBILINOGEN UA: 0.2 mg/dL (ref 0.0–1.0)
pH: 6 (ref 5.0–8.0)

## 2014-04-28 LAB — URINE MICROSCOPIC-ADD ON

## 2014-04-28 MED ORDER — DICYCLOMINE HCL 10 MG PO CAPS
10.0000 mg | ORAL_CAPSULE | Freq: Once | ORAL | Status: AC
  Administered 2014-04-28: 10 mg via ORAL
  Filled 2014-04-28: qty 1

## 2014-04-28 MED ORDER — DEXTROSE 5 % IN LACTATED RINGERS IV BOLUS
1000.0000 mL | Freq: Once | INTRAVENOUS | Status: AC
  Administered 2014-04-28: 1000 mL via INTRAVENOUS

## 2014-04-28 MED ORDER — LACTATED RINGERS IV SOLN
INTRAVENOUS | Status: DC
  Administered 2014-04-28: 20:00:00 via INTRAVENOUS

## 2014-04-28 MED ORDER — PROMETHAZINE HCL 25 MG/ML IJ SOLN
25.0000 mg | Freq: Once | INTRAMUSCULAR | Status: AC
  Administered 2014-04-28: 25 mg via INTRAVENOUS
  Filled 2014-04-28: qty 1

## 2014-04-28 NOTE — MAU Note (Signed)
Pt states that she has been vomiting throughout the pregnancy but that it became worse last night with diarrhea as well.

## 2014-04-28 NOTE — MAU Provider Note (Signed)
History     CSN: [id number]  Arrival date and time: 04/28/14 1825   None     Chief Complaint  Patient presents with  . Emesis During Pregnancy  . Diarrhea   HPI This is a 29 y.o. female at [redacted]w[redacted]d who presents with c/o nausea and vomiting. Also developed diarrhea last night. Has not had any sick contacts. C/O lots of diffuse abdominal cramping. No fever. No bleeding or lower abdominal pain.  RN Note: Pt states that she has been vomiting throughout the pregnancy but that it became worse last night with diarrhea as well.          OB History    Gravida Para Term Preterm AB TAB SAB Ectopic Multiple Living   0 0 0 0 0 0 1      Past Medical History  Diagnosis Date  . No pertinent past medical history   . IBS (irritable bowel syndrome)     Past Surgical History  Procedure Laterality Date  . No past surgeries    . Cesarean section  03/29/2011    Procedure: CESAREAN SECTION;  Surgeon: Brock Bad, MD;  Location: [facility];  Service: Gynecology;  Laterality: N/A;  Primary Cesarean Section Delivery Girl @ 0223, Apgars 8/9    Family History  Problem Relation Age of Onset  . Hypertension Mother   . Stroke Maternal Aunt   . Arthritis Maternal Aunt   . Cancer Maternal Aunt   . Arthritis Maternal Uncle   . Arthritis Paternal Aunt   . Arthritis Paternal Uncle   . Diabetes Maternal Grandmother   . Hypertension Maternal Grandmother   . Diabetes Paternal Grandmother   . Hypertension Paternal Grandmother   . Cancer Paternal Grandfather     History  Substance Use Topics  . Smoking status: Never Smoker   . Smokeless tobacco: Not on file  . Alcohol Use: No    Allergies: No Known Allergies  Prescriptions prior to admission  Medication Sig Dispense Refill Last Dose  . ondansetron (ZOFRAN-ODT) 4 MG disintegrating tablet Take 4 mg by mouth every 8 (eight) hours as needed for nausea or vomiting.   04/28/2014  . Prenatal Vit-Fe Fumarate-FA (PRENATAL MULTIVITAMIN) TABS  tablet Take 1 tablet by mouth daily at 12 noon.   04/27/2014 at Unknown time  . albuterol (PROVENTIL HFA;VENTOLIN HFA) 108 (90 BASE) MCG/ACT inhaler Inhale 2 puffs into the lungs every 6 (six) hours as needed for wheezing or shortness of breath.   Rescue    Review of Systems  Constitutional: Positive for malaise/fatigue. Negative for fever and chills.  Gastrointestinal: Positive for nausea, vomiting, abdominal pain and diarrhea. Negative for constipation.  Neurological: Negative for dizziness.   Physical Exam   Blood pressure 127/58, pulse 93, temperature 98.2 F (36.8 C), resp. rate 18, height  (1.651 m), weight 135 lb (61.236 kg), last menstrual period 02/24/2014.  Physical Exam  Constitutional: She is oriented to person, place, and time. She appears well-developed and well-nourished. No distress (but ill appearing).  HENT:  Head: Normocephalic.  Cardiovascular: Normal rate and regular rhythm.  Exam reveals no gallop and no friction rub.   No murmur heard. Respiratory: Effort normal and breath sounds normal. No respiratory distress.  GI: Soft. She exhibits no distension and no mass. There is tenderness (mild diffuse). There is no rebound and no guarding.  Musculoskeletal: Normal range of motion.  Neurological: She is alert and oriented to person, place, and time.  Skin: Skin is  warm and dry.  Psychiatric: She has a normal mood and affect.   Results for orders placed or performed during the hospital encounter of 04/28/14 (from the past 24 hour(s))  Urinalysis, Routine w reflex microscopic     Status: Abnormal   Collection Time: 04/28/14  7:00 PM  Result Value Ref Range   Color, Urine YELLOW YELLOW   APPearance CLEAR CLEAR   Specific Gravity, Urine >1.030 (H) 1.005 - 1.030   pH 6.0 5.0 - 8.0   Glucose, UA NEGATIVE NEGATIVE mg/dL   Hgb urine dipstick SMALL (A) NEGATIVE   Bilirubin Urine NEGATIVE NEGATIVE   Ketones, ur >80 (A) NEGATIVE mg/dL   Protein, ur NEGATIVE NEGATIVE  mg/dL   Urobilinogen, UA 0.2 0.0 - 1.0 mg/dL   Nitrite NEGATIVE NEGATIVE   Leukocytes, UA NEGATIVE NEGATIVE  Urine microscopic-add on     Status: Abnormal   Collection Time: 04/28/14  7:00 PM  Result Value Ref Range   Squamous Epithelial / LPF RARE RARE   WBC, UA 0-2 <3 WBC/hpf   RBC / HPF 3-6 <3 RBC/hpf   Bacteria, UA FEW (A) RARE    MAU Course  Procedures  MDM Will hydrate with IVF and give Bentyl for cramps and Phenergan for nausea.   Assessment and Plan  Report to oncoming provider  [facility] DistrictWILLIAMS,MARIE 04/28/2014, 7:39 PM   MDM Discussed patient with Dr. Henderson Cloudomblin. Ok for discharge with continued anti-emetics at home. Follow-up in the office as scheduled  A: [facility] at 2575w0d Nausea and vomiting in pregnancy prior to [redacted] weeks gestation  P: Discharge home Patient advised to continue Zofran as previously prescribed AVS contains information about diet for N/V in pregnancy Patient encouraged to follow-up in the office as scheduled for routine prenatal care or sooner if symptoms worsen Patient may return to MAU as needed or if her condition were to change or worsen   Marny LowensteinJulie N Wenzel, PA-C 04/28/2014 10:37 PM

## 2014-04-28 NOTE — MAU Note (Signed)
Pt presents to MAU with complaints of nausea, vomiting, and diarrhea for 2 days. She states she is not able to keep anything down. Denies any vaginal bleeding

## 2014-04-28 NOTE — Discharge Instructions (Signed)
Morning Sickness °Morning sickness is when you feel sick to your stomach (nauseous) during pregnancy. You may feel sick to your stomach and throw up (vomit). You may feel sick in the morning, but you can feel this way any time of day. Some women feel very sick to their stomach and cannot stop throwing up (hyperemesis gravidarum). °HOME CARE °· Only take medicines as told by your doctor. °· Take multivitamins as told by your doctor. Taking multivitamins before getting pregnant can stop or lessen the harshness of morning sickness. °· Eat dry toast or unsalted crackers before getting out of bed. °· Eat 5 to 6 small meals a day. °· Eat dry and bland foods like rice and baked potatoes. °· Do not drink liquids with meals. Drink between meals. °· Do not eat greasy, fatty, or spicy foods. °· Have someone cook for you if the smell of food causes you to feel sick or throw up. °· If you feel sick to your stomach after taking prenatal vitamins, take them at night or with a snack. °· Eat protein when you need a snack (nuts, yogurt, cheese). °· Eat unsweetened gelatins for dessert. °· Wear a bracelet used for sea sickness (acupressure wristband). °· Go to a doctor that puts thin needles into certain body points (acupuncture) to improve how you feel. °· Do not smoke. °· Use a humidifier to keep the air in your house free of odors. °· Get lots of fresh air. °GET HELP IF: °· You need medicine to feel better. °· You feel dizzy or lightheaded. °· You are losing weight. °GET HELP RIGHT AWAY IF:  °· You feel very sick to your stomach and cannot stop throwing up. °· You pass out (faint). °MAKE SURE YOU: °· Understand these instructions. °· Will watch your condition. °· Will get help right away if you are not doing well or get worse. °Document Released: 04/27/2004 Document Revised: 03/25/2013 Document Reviewed: 09/04/2012 °Exit[employer]® Patient Information ©2015 [employer]. This information is not intended to replace advice given to you by  your health care provider. Make sure you discuss any questions you have with your health care provider. ° °Eating Plan for Hyperemesis Gravidarum °Severe cases of hyperemesis gravidarum can lead to dehydration and malnutrition. The hyperemesis eating plan is one way to lessen the symptoms of nausea and vomiting. It is often used with prescribed medicines to control your symptoms.  °WHAT CAN I DO TO RELIEVE MY SYMPTOMS? °Listen to your body. Everyone is different and has different preferences. Find what works best for you. Some of the following things may help: °· Eat and drink slowly. °· Eat 5-6 small meals daily instead of 3 large meals.   °· Eat crackers before you get out of bed in the morning.   °· Starchy foods are usually well tolerated (such as cereal, toast, bread, potatoes, pasta, rice, and pretzels).   °· Ginger may help with nausea. Add ¼ tsp ground ginger to hot tea or choose ginger tea.   °· Try drinking 100% fruit juice or an electrolyte drink. °· Continue to take your prenatal vitamins as directed by your health care provider. If you are having trouble taking your prenatal vitamins, talk with your health care provider about different options. °· Include at least 1 serving of protein with your meals and snacks (such as meats or poultry, beans, nuts, eggs, or yogurt). Try eating a protein-rich snack before bed (such as cheese and crackers or a half turkey or peanut butter sandwich). °WHAT THINGS SHOULD I   AVOID TO REDUCE MY SYMPTOMS? °The following things may help reduce your symptoms: °· Avoid foods with strong smells. Try eating meals in well-ventilated areas that are free of odors. °· Avoid drinking water or other beverages with meals. Try not to drink anything less than 30 minutes before and after meals. °· Avoid drinking more than 1 cup of fluid at a time. °· Avoid fried or high-fat foods, such as butter and cream sauces. °· Avoid spicy foods. °· Avoid skipping meals the best you can. Nausea can be  more intense on an empty stomach. If you cannot tolerate food at that time, do not force it. Try sucking on ice chips or other frozen items and make up the calories later. °· Avoid lying down within 2 hours after eating. °Document Released: 01/15/2007 Document Revised: 03/25/2013 Document Reviewed: 01/22/2013 °Exit[employer]® Patient Information ©2015 [employer]. This information is not intended to replace advice given to you by your health care provider. Make sure you discuss any questions you have with your health care provider. ° °

## 2014-05-27 ENCOUNTER — Other Ambulatory Visit: Payer: Self-pay | Admitting: Obstetrics and Gynecology

## 2014-05-27 ENCOUNTER — Encounter (HOSPITAL_COMMUNITY): Payer: Self-pay | Admitting: *Deleted

## 2014-05-27 ENCOUNTER — Inpatient Hospital Stay (HOSPITAL_COMMUNITY)
Admission: AD | Admit: 2014-05-27 | Discharge: 2014-05-27 | Disposition: A | Discharge status: Home / Self Care | Payer: BLUE CROSS/BLUE SHIELD | Source: Ambulatory Visit | Attending: Obstetrics and Gynecology | Admitting: Obstetrics and Gynecology

## 2014-05-27 DIAGNOSIS — O219 Vomiting of pregnancy, unspecified: Secondary | ICD-10-CM

## 2014-05-27 DIAGNOSIS — O210 Mild hyperemesis gravidarum: Secondary | ICD-10-CM | POA: Diagnosis present

## 2014-05-27 DIAGNOSIS — Z3A13 13 weeks gestation of pregnancy: Secondary | ICD-10-CM | POA: Diagnosis not present

## 2014-05-27 LAB — URINALYSIS, ROUTINE W REFLEX MICROSCOPIC
BILIRUBIN URINE: NEGATIVE
Glucose, UA: NEGATIVE mg/dL
KETONES UR: 15 mg/dL — AB
Leukocytes, UA: NEGATIVE
NITRITE: NEGATIVE
PH: 6 (ref 5.0–8.0)
PROTEIN: NEGATIVE mg/dL
SPECIFIC GRAVITY, URINE: 1.025 (ref 1.005–1.030)
UROBILINOGEN UA: 0.2 mg/dL (ref 0.0–1.0)

## 2014-05-27 LAB — URINE MICROSCOPIC-ADD ON

## 2014-05-27 MED ORDER — SODIUM CHLORIDE 0.9 % IV SOLN
25.0000 mg | Freq: Once | INTRAVENOUS | Status: AC
  Administered 2014-05-27: 25 mg via INTRAVENOUS
  Filled 2014-05-27: qty 1

## 2014-05-27 MED ORDER — LACTATED RINGERS IV SOLN
Freq: Once | INTRAVENOUS | Status: AC
  Administered 2014-05-27: 19:00:00 via INTRAVENOUS

## 2014-05-27 NOTE — MAU Note (Signed)
Pt sent from MD office with vomiting, has tried several antiemetics, hasn't worked.  Tried just fluids yesterday, still vomiting.  Denies diarrhea.  Denies pain or bleeding.

## 2014-05-27 NOTE — MAU Provider Note (Signed)
History     CSN: [id number]  Arrival date and time: 05/27/14 1456   None     Chief Complaint  Patient presents with  . Emesis During Pregnancy   HPI  29 y.o.G2P1001@[redacted]w[redacted]d  presents to MAU for persistent Nausea and vomiting. Pt sent from MD office with vomiting, has tried several antiemetics, hasn't worked. Tried just fluids yesterday, still vomiting. Denies diarrhea. Denies pain or bleeding.               Past Medical History  Diagnosis Date  . No pertinent past medical history   . IBS (irritable bowel syndrome)     Past Surgical History  Procedure Laterality Date  . No past surgeries    . Cesarean section  03/29/2011    Procedure: CESAREAN SECTION;  Surgeon: Brock Bad, MD;  Location: [facility];  Service: Gynecology;  Laterality: N/A;  Primary Cesarean Section Delivery Girl @ 0223, Apgars 8/9    Family History  Problem Relation Age of Onset  . Hypertension Mother   . Stroke Maternal Aunt   . Arthritis Maternal Aunt   . Cancer Maternal Aunt   . Arthritis Maternal Uncle   . Arthritis Paternal Aunt   . Arthritis Paternal Uncle   . Diabetes Maternal Grandmother   . Hypertension Maternal Grandmother   . Diabetes Paternal Grandmother   . Hypertension Paternal Grandmother   . Cancer Paternal Grandfather     History  Substance Use Topics  . Smoking status: Never Smoker   . Smokeless tobacco: Not on file  . Alcohol Use: No    Allergies: No Known Allergies  Prescriptions prior to admission  Medication Sig Dispense Refill Last Dose  . ondansetron (ZOFRAN-ODT) 4 MG disintegrating tablet Take 4 mg by mouth every 8 (eight) hours as needed for nausea or vomiting.   05/26/2014 at Unknown time  . Prenatal Vit-Fe Fumarate-FA (PRENATAL MULTIVITAMIN) TABS tablet Take 1 tablet by mouth daily at 12 noon.   Past Month at Unknown time  . albuterol (PROVENTIL HFA;VENTOLIN HFA) 108 (90 BASE) MCG/ACT inhaler Inhale 2 puffs into the lungs every 6 (six) hours as needed  for wheezing or shortness of breath.   Rescue    Review of Systems  Constitutional: Negative for fever and chills.  Eyes: Negative for blurred vision.  Respiratory: Negative for cough and shortness of breath.   Cardiovascular: Negative for chest pain.  Gastrointestinal: Positive for nausea and vomiting.  Genitourinary: Negative for dysuria, urgency and frequency.  Musculoskeletal: Negative for myalgias.  Skin: Negative for itching and rash.  Neurological: Positive for weakness. Negative for dizziness and headaches.  Endo/Heme/Allergies: Negative.   Psychiatric/Behavioral: Negative.    Physical Exam   Blood pressure 103/50, pulse 84, temperature 98.3 F (36.8 C), temperature source Oral, resp. rate 16, height 5' 5.5" (1.664 m), weight 59.512 kg (131 lb 3.2 oz), last menstrual period 02/24/2014.  Physical Exam  Constitutional: She is oriented to person, place, and time. She appears well-developed and well-nourished. No distress.  HENT:  Head: Normocephalic and atraumatic.  Neck: Normal range of motion.  Cardiovascular: Normal rate.   Respiratory: Effort normal. No respiratory distress.  GI: Soft. She exhibits no distension and no mass. There is no tenderness. There is no rebound and no guarding.  Musculoskeletal: Normal range of motion.  Neurological: She is alert and oriented to person, place, and time.  Skin: Skin is warm and dry.  Psychiatric: She has a normal mood and affect. Her behavior is normal. Judgment and  thought content normal.    MAU Course  Procedures  IVF hydration and antiemetics  Assessment and Plan  A: IUP @ 13+1     Hyperemesis P: D/C to home per Dr Eyvonne LeftGrewell      RTO at regular scheduled appt  [facility],Lori Grissett 05/27/2014, 4:57 PM

## 2014-05-27 NOTE — Discharge Instructions (Signed)
Eating Plan for Hyperemesis Gravidarum  Severe cases of hyperemesis gravidarum can lead to dehydration and malnutrition. The hyperemesis eating plan is one way to lessen the symptoms of nausea and vomiting. It is often used with prescribed medicines to control your symptoms.   WHAT CAN I DO TO RELIEVE MY SYMPTOMS?  Listen to your body. Everyone is different and has different preferences. Find what works best for you. Some of the following things may help:  · Eat and drink slowly.  · Eat 5-6 small meals daily instead of 3 large meals.    · Eat crackers before you get out of bed in the morning.    · Starchy foods are usually well tolerated (such as cereal, toast, bread, potatoes, pasta, rice, and pretzels).    · Ginger may help with nausea. Add ¼ tsp ground ginger to hot tea or choose ginger tea.    · Try drinking 100% fruit juice or an electrolyte drink.  · Continue to take your prenatal vitamins as directed by your health care provider. If you are having trouble taking your prenatal vitamins, talk with your health care provider about different options.  · Include at least 1 serving of protein with your meals and snacks (such as meats or poultry, beans, nuts, eggs, or yogurt). Try eating a protein-rich snack before bed (such as cheese and crackers or a half turkey or peanut butter sandwich).  WHAT THINGS SHOULD I AVOID TO REDUCE MY SYMPTOMS?  The following things may help reduce your symptoms:  · Avoid foods with strong smells. Try eating meals in well-ventilated areas that are free of odors.  · Avoid drinking water or other beverages with meals. Try not to drink anything less than 30 minutes before and after meals.  · Avoid drinking more than 1 cup of fluid at a time.  · Avoid fried or high-fat foods, such as butter and cream sauces.  · Avoid spicy foods.  · Avoid skipping meals the best you can. Nausea can be more intense on an empty stomach. If you cannot tolerate food at that time, do not force it. Try sucking on  ice chips or other frozen items and make up the calories later.  · Avoid lying down within 2 hours after eating.  Document Released: 01/15/2007 Document Revised: 03/25/2013 Document Reviewed: 01/22/2013  ExitCare® Patient Information ©2015 [employer]. This information is not intended to replace advice given to you by your health care provider. Make sure you discuss any questions you have with your health care provider.

## 2014-05-28 LAB — CYTOLOGY - PAP

## 2014-07-07 ENCOUNTER — Other Ambulatory Visit (HOSPITAL_COMMUNITY): Payer: Self-pay | Admitting: Obstetrics and Gynecology

## 2014-07-07 DIAGNOSIS — O283 Abnormal ultrasonic finding on antenatal screening of mother: Secondary | ICD-10-CM

## 2014-07-20 ENCOUNTER — Ambulatory Visit (HOSPITAL_COMMUNITY): Admission: RE | Admit: 2014-07-20 | Payer: BLUE CROSS/BLUE SHIELD | Source: Ambulatory Visit

## 2014-07-28 ENCOUNTER — Ambulatory Visit (HOSPITAL_COMMUNITY)
Admission: RE | Admit: 2014-07-28 | Discharge: 2014-07-28 | Disposition: A | Discharge status: Home / Self Care | Payer: BLUE CROSS/BLUE SHIELD | Source: Ambulatory Visit | Attending: Obstetrics and Gynecology | Admitting: Obstetrics and Gynecology

## 2014-07-28 ENCOUNTER — Other Ambulatory Visit (HOSPITAL_COMMUNITY): Payer: Self-pay | Admitting: Obstetrics and Gynecology

## 2014-07-28 DIAGNOSIS — Z315 Encounter for genetic counseling: Secondary | ICD-10-CM | POA: Insufficient documentation

## 2014-07-28 DIAGNOSIS — O282 Abnormal cytological finding on antenatal screening of mother: Secondary | ICD-10-CM | POA: Insufficient documentation

## 2014-07-28 DIAGNOSIS — Z3A22 22 weeks gestation of pregnancy: Secondary | ICD-10-CM | POA: Diagnosis not present

## 2014-07-28 DIAGNOSIS — O351XX0 Maternal care for (suspected) chromosomal abnormality in fetus, not applicable or unspecified: Secondary | ICD-10-CM | POA: Diagnosis not present

## 2014-07-28 DIAGNOSIS — O351XX1 Maternal care for (suspected) chromosomal abnormality in fetus, fetus 1: Secondary | ICD-10-CM

## 2014-07-28 DIAGNOSIS — O3510X0 Maternal care for (suspected) chromosomal abnormality in fetus, unspecified, not applicable or unspecified: Secondary | ICD-10-CM

## 2014-07-28 DIAGNOSIS — O289 Unspecified abnormal findings on antenatal screening of mother: Secondary | ICD-10-CM

## 2014-07-28 LAB — AP-AFP (ALPHA FETOPROTEIN)

## 2014-07-28 LAB — ROUTINE CHROMOSOME - KARYOTYPE + FISH

## 2014-07-28 NOTE — Progress Notes (Signed)
Genetic Counseling  High-Risk Gestation Note  Appointment Date:  07/28/2014 Referred By: Harold Hedge, MD Date of Birth:  1985/07/28 Partner:  [name]   Pregnancy History: [id number] Estimated Date of Delivery: 12/01/14 Estimated Gestational Age: [redacted]w[redacted]d Attending: Particia Nearing, MD   Kathleen Logan and her husband, Mr. Kathleen Logan, were seen for genetic counseling because of a high risk for fetal Down syndrome from noninvasive prenatal screening (NIPS)/cell free DNA testing.   Kathleen Logan had NIPS ([facility] through [place] [facility]) through her OB office, which indicated a high risk for trisomy 67. 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 Kathleen Logan previously had first trimester screening, which was within normal range (1 in 1051 Down syndrome risk).  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 The couple was counseled that a hypoplastic nasal bone is a highly sensitive and specific marker for Down syndrome.  It is present in approximately 1% of chromosomally normal fetuses, but up to 70% of fetuses with Down syndrome, 55 % with trisomy 18, and 34% with trisomy 13. The nasal bone is typically considered to be hypoplastic if it is less than 2.5 mm in length.  The length of the fetal nasal bone varies by race and ethnicity in second trimester fetuses.  In the African American population, the LR for Down syndrome is approximately 8.5, thus, increasing the risk for fetal Down syndrome above the first trimester screening result.   We specifically discussed the NIPS result. We discussed that this testing identifies >99% of pregnancies with Down syndrome with a false positive rate of <0.5%. We reviewed that this testing is highly sensitive and specific, but is not considered diagnostic. We discussed that this result is associated with an approximate 89% positive predictive  value for Down syndrome in the current pregnancy, given the parameters of the test and the a priori information for the current pregnancy. We did review that the cell free fetal DNA test can not distinguish between aneuploidy confined to the placenta, trisomy 21, translocation 21, or mosaic trisomy 21. We discussed the availability of amniocentesis or peripheral blood chromosome analysis for confirmation of the diagnosis. We reviewed the risks, benefits, and limitations of amniocentesis including the associated 1 in 300-500 risk for complications, including spontaneous pregnancy loss.    Considering the very high suspicion of Down syndrome, they were counseled in detail regarding this diagnosis. We discussed that there are different types of Down syndrome, and each type is determined by the arrangement of the #21 chromosomes. Approximately 95% of cases of Down syndrome are trisomy 21 and 2-4% are due to a translocation involving chromosome 21. We reviewed chromosomes, nondisjunction, and that chromosome division errors happen by chance and are not usually inherited. They understand that confirmatory testing will provide an actual karyotype and will help to determine accurate risks of recurrence for a future pregnancy. However, we discussed that karyotype analysis is not able to predict the specific features of Down syndrome that would be present in an individual.   This couple was counseled that Down syndrome occurs once per every 800 births and is associated with specific features. However, there is a high degree of variability seen among children who have this condition, meaning that every child with Down syndrome will not be affected in exactly the same way, and some children will have more or less features than others. They were counseled that approximately half of individuals  with Down syndrome have a cardiac anomaly and ~10-15% have an intestinal difference. Other anomalies of various organ systems have  also been described in association with this diagnosis. Although the major anatomy appears structurally normal by ultrasound, a fetal echocardiogram is recommended for a detailed evaluation of the fetal heart.   We discussed that in general most individuals with Down syndrome have mild to moderate intellectual disability and likely will require extra assistance with school work. Approximately 70-80% of children with Down syndrome have hypotonia which may lead to delays in sitting, walking, and talking. Hypotonia does tend to improve with age and early intervention services such as physical, occupational, and speech therapies can help with achievement of developmental milestones. We discussed that these therapies are typically provided to children (with qualifying diagnoses) from birth until age 4. We also discussed that Down syndrome is associated with characteristic facial features. Because of these facial features, many children with Down syndrome look similar to each other, but they were reminded that each child with Down syndrome is unique and will have many more features in common with his or her own family members.   We also reviewed that the AAP have established health supervision guidelines for individuals with Down syndrome. These guidelines provide medical management recommendations for various stages of life and can be a resource for families who have a child with Down syndrome as well as for health professionals. We also discussed that providing children with Down syndrome with a stimulating physical and social environment, as well as ensuring that the child receives adequate medical care and appropriate therapies, will help these children to reach their full potential. With the advances in medical technology, early intervention, and supportive therapies, many individuals with Down syndrome are able to live with an increasing degree of independence. Today, many adults with Down syndrome care for  themselves, have jobs, and often times live in group homes or apartments where assistance is available if needed.   We discussed local and national support organizations. The couple stated that they previously found these online resources and have read a great deal about Down syndrome. Additionally, we discussed that postnatal health management can be coordinated by a medical geneticist as well as with a multidisciplinary team of physicians (Down syndrome clinic). This couple was encouraged to contact us if they have any additional questions/concerns or if they need additional supportive or informational recommendations.   We discussed the options of continuing the pregnancy with expectant management including fetal echocardiogram and serial ultrasound assessment. We also discussed the options of adoption and termination of pregnancy. The couple understands that the legal limit for termination of pregnancy in [place] is [redacted] weeks gestation, but that this option is available in other states at the current gestational age, including [place]. The couple stated that they are not considering adoption at this time. They are further considering the option of termination of pregnancy versus continuing the pregnancy. They were provided with the written resource, Precious Lives, Painful Choices at the time of today's visit as well as written resources regarding Down syndrome.   After careful consideration, Kathleen Logan and her partner elected to proceed with amniocentesis at the time of today's visit for [facility] and karyotype analysis to confirm the NIPS result. They planned to use this information to assist in their decision making regarding how to proceed with the pregnancy. Complete report from today's amniocentesis is sent under separate cover.   Kathleen Logan was provided with written information  regarding sickle cell anemia (SCA) including the carrier frequency and incidence in the  African-American population, the availability of carrier testing and prenatal diagnosis if indicated.  In addition, we discussed that hemoglobinopathies are routinely screened for as part of the [place] newborn screening panel.  Hemoglobin electrophoresis was previously performed through her OB office, which was within normal limits.  Detailed family histories were not obtained at the time of today's visit given the nature of today's discussion and time constraints. Without further information regarding the provided family history, an accurate genetic risk cannot be calculated. Further genetic counseling is warranted if more information is obtained.  Ms. Jamse BelfastJercara S Rutledge denied exposure to environmental toxins or chemical agents. She denied the use of alcohol, tobacco or street drugs. She denied significant viral illnesses during the course of her pregnancy. Her medical and surgical histories were noncontributory.   I counseled this couple regarding the above risks and available options.  The approximate face-to-face time with the genetic counselor was 60 minutes.  Quinn PlowmanKaren Trayvion Embleton, MS,  Certified [employer] Counselor 07/28/2014

## 2014-07-29 ENCOUNTER — Other Ambulatory Visit (HOSPITAL_COMMUNITY): Payer: Self-pay

## 2014-07-30 ENCOUNTER — Telehealth (HOSPITAL_COMMUNITY): Payer: Self-pay | Admitting: MS"

## 2014-07-30 ENCOUNTER — Other Ambulatory Visit (HOSPITAL_COMMUNITY): Payer: Self-pay | Admitting: Obstetrics and Gynecology

## 2014-07-30 NOTE — Telephone Encounter (Signed)
Called Ms.  Kathleen Logan's husband, Mr. Kathleen Logan to discuss the preliminary FISH results from her amniocentesis. 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  We again discussed the limitations of FISH and that final results are still pending and will be available in 1-2 weeks.  We reviewed that the exact features/severity of Down syndrome cannot be predicted from chromosome analysis, which the couple is aware. Reviewed that targeted ultrasound prenatally can assess fetal growth and anatomy to screen for congenital anomalies, and that the prenatal ultrasounds to date have reportedly been within normal limits, aside from the finding of absent nasal bone. However, we reviewed that ultrasound cannot diagnose or rule out all birth defects. We will contact the couple when final karyotype analysis is available. Reviewed that this will help assess recurrence risk for future pregnancies. All questions were answered to his satisfaction, he was encouraged to call with additional questions or concerns.  Quinn PlowmanKaren Kaileigh Viswanathan, MS Patent attorneyCertified Genetic Counselor

## 2014-08-02 DIAGNOSIS — Q909 Down syndrome, unspecified: Secondary | ICD-10-CM

## 2014-08-05 ENCOUNTER — Other Ambulatory Visit (HOSPITAL_COMMUNITY): Payer: Self-pay | Admitting: Obstetrics and Gynecology

## 2014-08-06 ENCOUNTER — Other Ambulatory Visit (HOSPITAL_COMMUNITY): Payer: Self-pay | Admitting: Obstetrics and Gynecology

## 2014-08-06 ENCOUNTER — Telehealth (HOSPITAL_COMMUNITY): Payer: Self-pay | Admitting: MS"

## 2014-08-06 NOTE — Telephone Encounter (Signed)
Called Mr. Omelia BlackwaterLuther "Thurston Poundsrey" Nelly RoutDawkins, spouse of Ms. Jamse BelfastJercara S Rutledge regarding final chromosome results from amniocentesis. Reviewed that [facility]. 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 Discussed with Mr. Nelly RoutDawkins that this can occur sporadically or be inherited from a parent. The couple reportedly have a healthy child together. We discussed that this history indicates that the 21;21 translocation is not likely inherited, given that a 21;21 translocation carrier would have a 100% recurrence risk for Down syndrome in a pregnancy. However, this history does not rule out the presence of a 21;21 translocation in either parent, given the possibility for mosaicism. We discussed the option of peripheral blood chromosome analysis for both Ms. Antony Madurautledge and Mr. Stavola, which can be done at any point.   Mr. Nelly RoutDawkins expressed understanding of this information. He stated that they elected to end the pregnancy, [place]. He stated that it has been hard for both of them and that OmanJercara took the week off of work. I asked about their support system and discussed that there are local resources available to them, including bereavement counselors. He stated that the center in [place] was very helpful with plugging them in with therapists and that somebody from there has called each day to check in on OmanJercara. Encouraged Mr. Jent to let us know if they would like additional support locally and to contact us with additional questions.   Clydie BraunKaren Alante Weimann 08/06/2014 10:58 AM

## 2015-02-02 ENCOUNTER — Encounter (HOSPITAL_COMMUNITY): Payer: Self-pay | Admitting: *Deleted

## 2015-07-04 ENCOUNTER — Emergency Department (HOSPITAL_COMMUNITY): Payer: BLUE CROSS/BLUE SHIELD

## 2015-07-04 ENCOUNTER — Encounter (HOSPITAL_COMMUNITY): Payer: Self-pay

## 2015-07-04 ENCOUNTER — Ambulatory Visit (HOSPITAL_COMMUNITY)
Admission: EM | Admit: 2015-07-04 | Discharge: 2015-07-05 | Disposition: A | Discharge status: Home / Self Care | Payer: BLUE CROSS/BLUE SHIELD | Attending: Obstetrics and Gynecology | Admitting: Obstetrics and Gynecology

## 2015-07-04 DIAGNOSIS — J45909 Unspecified asthma, uncomplicated: Secondary | ICD-10-CM | POA: Diagnosis not present

## 2015-07-04 DIAGNOSIS — O001 Tubal pregnancy without intrauterine pregnancy: Secondary | ICD-10-CM | POA: Diagnosis not present

## 2015-07-04 DIAGNOSIS — O4690 Antepartum hemorrhage, unspecified, unspecified trimester: Secondary | ICD-10-CM

## 2015-07-04 DIAGNOSIS — K589 Irritable bowel syndrome without diarrhea: Secondary | ICD-10-CM | POA: Insufficient documentation

## 2015-07-04 DIAGNOSIS — O00109 Unspecified tubal pregnancy without intrauterine pregnancy: Secondary | ICD-10-CM

## 2015-07-04 LAB — URINE MICROSCOPIC-ADD ON: WBC UA: NONE SEEN WBC/hpf (ref 0–5)

## 2015-07-04 LAB — WET PREP, GENITAL
SPERM: NONE SEEN
Trich, Wet Prep: NONE SEEN
YEAST WET PREP: NONE SEEN

## 2015-07-04 LAB — CBC
HEMATOCRIT: 41.4 % (ref 36.0–46.0)
HEMOGLOBIN: 14.2 g/dL (ref 12.0–15.0)
MCH: 32.1 pg (ref 26.0–34.0)
MCHC: 34.3 g/dL (ref 30.0–36.0)
MCV: 93.7 fL (ref 78.0–100.0)
Platelets: 336 10*3/uL (ref 150–400)
RBC: 4.42 MIL/uL (ref 3.87–5.11)
RDW: 12.4 % (ref 11.5–15.5)
WBC: 11.9 10*3/uL — AB (ref 4.0–10.5)

## 2015-07-04 LAB — URINALYSIS, ROUTINE W REFLEX MICROSCOPIC
Bilirubin Urine: NEGATIVE
Glucose, UA: NEGATIVE mg/dL
Ketones, ur: 40 mg/dL — AB
LEUKOCYTES UA: NEGATIVE
Nitrite: NEGATIVE
PH: 6.5 (ref 5.0–8.0)
Protein, ur: NEGATIVE mg/dL
SPECIFIC GRAVITY, URINE: 1.017 (ref 1.005–1.030)

## 2015-07-04 LAB — COMPREHENSIVE METABOLIC PANEL
ALBUMIN: 4 g/dL (ref 3.5–5.0)
ALT: 11 U/L — ABNORMAL LOW (ref 14–54)
ANION GAP: 9 (ref 5–15)
AST: 18 U/L (ref 15–41)
Alkaline Phosphatase: 47 U/L (ref 38–126)
BILIRUBIN TOTAL: 0.4 mg/dL (ref 0.3–1.2)
BUN: <5 mg/dL — ABNORMAL LOW (ref 6–20)
CALCIUM: 9.1 mg/dL (ref 8.9–10.3)
CO2: 23 mmol/L (ref 22–32)
Chloride: 103 mmol/L (ref 101–111)
Creatinine, Ser: 0.67 mg/dL (ref 0.44–1.00)
GLUCOSE: 98 mg/dL (ref 65–99)
POTASSIUM: 3.7 mmol/L (ref 3.5–5.1)
Sodium: 135 mmol/L (ref 135–145)
TOTAL PROTEIN: 7.2 g/dL (ref 6.5–8.1)

## 2015-07-04 LAB — RAPID HIV SCREEN (HIV 1/2 AB+AG)
HIV 1/2 Antibodies: NONREACTIVE
HIV-1 P24 ANTIGEN - HIV24: NONREACTIVE

## 2015-07-04 LAB — HCG, QUANTITATIVE, PREGNANCY: HCG, BETA CHAIN, QUANT, S: 4125 m[IU]/mL — AB (ref <5)

## 2015-07-04 LAB — I-STAT BETA HCG BLOOD, ED (MC, WL, AP ONLY)

## 2015-07-04 LAB — ABO/RH: ABO/RH(D): O POS

## 2015-07-04 LAB — LIPASE, BLOOD: Lipase: 23 U/L (ref 11–51)

## 2015-07-04 MED ORDER — SODIUM CHLORIDE 0.9 % IV BOLUS (SEPSIS)
1000.0000 mL | Freq: Once | INTRAVENOUS | Status: AC
  Administered 2015-07-04: 1000 mL via INTRAVENOUS

## 2015-07-04 MED ORDER — METOCLOPRAMIDE HCL 5 MG/ML IJ SOLN
10.0000 mg | Freq: Once | INTRAMUSCULAR | Status: AC
  Administered 2015-07-04: 10 mg via INTRAVENOUS
  Filled 2015-07-04: qty 2

## 2015-07-04 MED ORDER — ONDANSETRON 4 MG PO TBDP
ORAL_TABLET | ORAL | Status: AC
  Filled 2015-07-04: qty 1

## 2015-07-04 MED ORDER — MORPHINE SULFATE (PF) 4 MG/ML IV SOLN
4.0000 mg | Freq: Once | INTRAVENOUS | Status: AC
  Administered 2015-07-04: 4 mg via INTRAVENOUS
  Filled 2015-07-04: qty 1

## 2015-07-04 MED ORDER — ONDANSETRON 4 MG PO TBDP
4.0000 mg | ORAL_TABLET | Freq: Once | ORAL | Status: AC | PRN
  Administered 2015-07-04: 4 mg via ORAL

## 2015-07-04 MED ORDER — MORPHINE SULFATE (PF) 4 MG/ML IV SOLN
4.0000 mg | Freq: Once | INTRAVENOUS | Status: AC
Start: 2015-07-04 — End: 2015-07-04
  Administered 2015-07-04: 4 mg via INTRAVENOUS
  Filled 2015-07-04: qty 1

## 2015-07-04 NOTE — ED Provider Notes (Signed)
Medical screening examination/treatment/procedure(s) were conducted as a shared visit with non-physician practitioner(s) and myself.  I personally evaluated the patient during the encounter.   EKG Interpretation None       See the written copy of this report in the patient's paper medical record.  These results did not interface directly into the electronic medical record and are summarized here.  30 yo F with a chief complaint of left lower quadrant abdominal tenderness. Going on for the past few days. Was found to be pregnant here and then on ultrasound found to have an ectopic pregnancy. Patient has had some spotting and then some more significant bleeding over the past couple days. On exam patient is well-appearing and nontoxic. Has abdominal tenderness worse in the left lower quadrant. OB consult.  Will transfer.  CRITICAL CARE Performed by: Rae Roamaniel Patrick Alixander Rallis   Total critical care time: 30 minutes  Critical care time was exclusive of separately billable procedures and treating other patients.  Critical care was necessary to treat or prevent imminent or life-threatening deterioration.  Critical care was time spent personally by me on the following activities: development of treatment plan with patient and/or surrogate as well as nursing, discussions with consultants, evaluation of patient's response to treatment, examination of patient, obtaining history from patient or surrogate, ordering and performing treatments and interventions, ordering and review of laboratory studies, ordering and review of radiographic studies, pulse oximetry and re-evaluation of patient's condition.   Melene Planan Stefanie Hodgens, DO 07/04/15 2244

## 2015-07-04 NOTE — ED Notes (Signed)
Transported by [facility] at this time.  Mother with patient.

## 2015-07-04 NOTE — ED Notes (Signed)
Pt here with c/o lower abdominal pain, worse on left, onset last night but pain worsened this morning at church. Emesis x 1 in triage. Denies diarrhea today.

## 2015-07-04 NOTE — ED Provider Notes (Addendum)
CSN: [id number]     Arrival date & time 07/04/15  1550 History   First MD Initiated Contact with Patient 07/04/15 1738     Chief Complaint  Patient presents with  . Abdominal Pain     (Consider location/radiation/quality/duration/timing/severity/associated sxs/prior Treatment) HPI   30 year old female presents with complaints of abdominal pain. Patient reports gradual onset of progressive worsening lower abdominal pain since yesterday. She described pain as a sharp stabbing sensation initially started in the left lower quadrant and now radiating across her abdomen, worsening with palpation and with movement. She endorses extreme nausea and has vomited multiple times while he she is in the ED. Vomitus is nonbloody nonbilious. She also report having light vaginal bleeding for the past 1 week. Her last menstrual period was the first week of January. She was placed on birth control in January to regulate her menstruation. She is a G2P1 with a miscarriage last May when she was 5 months pregnant. She denies any history of STDs. She denies any recent injury or trauma. No complaint of fever, chills, chest pain, shortness of breath, dysuria, hematuria, vaginal discharge, or rash.  Past Medical History  Diagnosis Date  . No pertinent past medical history   . IBS (irritable bowel syndrome)    Past Surgical History  Procedure Laterality Date  . No past surgeries    . Cesarean section  03/29/2011    Procedure: CESAREAN SECTION;  Surgeon: Brock Bad, MD;  Location: [facility];  Service: Gynecology;  Laterality: N/A;  Primary Cesarean Section Delivery Girl @ 0223, Apgars 8/9   Family History  Problem Relation Age of Onset  . Hypertension Mother   . Stroke Maternal Aunt   . Arthritis Maternal Aunt   . Cancer Maternal Aunt   . Arthritis Maternal Uncle   . Arthritis Paternal Aunt   . Arthritis Paternal Uncle   . Diabetes Maternal Grandmother   . Hypertension Maternal Grandmother   . Diabetes  Paternal Grandmother   . Hypertension Paternal Grandmother   . Cancer Paternal Grandfather    Social History  Substance Use Topics  . Smoking status: Never Smoker   . Smokeless tobacco: None  . Alcohol Use: No   OB History    Gravida Para Term Preterm AB TAB SAB Ectopic Multiple Living   0 0 0 0 0 0 1     Review of Systems  All other systems reviewed and are negative.     Allergies  Review of patient's allergies indicates no known allergies.  Home Medications   Prior to Admission medications   Medication Sig Start Date End Date Taking? Authorizing Provider  albuterol (PROVENTIL HFA;VENTOLIN HFA) 108 (90 BASE) MCG/ACT inhaler Inhale 2 puffs into the lungs every 6 (six) hours as needed for wheezing or shortness of breath.    Historical Provider, MD  ondansetron (ZOFRAN-ODT) 4 MG disintegrating tablet Take 4 mg by mouth every 8 (eight) hours as needed for nausea or vomiting.    Historical Provider, MD  Prenatal Vit-Fe Fumarate-FA (PRENATAL MULTIVITAMIN) TABS tablet Take 1 tablet by mouth daily at 12 noon.    Historical Provider, MD   BP 132/92 mmHg  Pulse 74  Temp(Src) 98.8 F (37.1 C) (Oral)  Resp 18  SpO2 100% Physical Exam  Constitutional: She appears well-developed and well-nourished. No distress.  African-American female appears uncomfortable, actively vomiting.  HENT:  Head: Atraumatic.  Eyes: Conjunctivae are normal.  Neck: Neck supple.  Cardiovascular: Normal rate and regular rhythm.  Pulmonary/Chest: Effort normal and breath sounds normal.  Abdominal: Soft. Bowel sounds are normal. She exhibits no distension. There is tenderness (Diffuse abdominal tenderness most significant to low abdomen with guarding but no rebound tenderness.).  Genitourinary:  Chaperone present during exam. No inguinal lymphadenopathy or inguinal hernia noted. Normal external genitalia. Pain with speculum insertion. Moderate amount of blood noted in vaginal vault. Close cervical os  free of rashes or lesion. On bimanual examination patient has bilateral adnexal tenderness and cervical motion tenderness.  Neurological: She is alert.  Skin: No rash noted.  Psychiatric: She has a normal mood and affect.  Nursing note and vitals reviewed.   ED Course  Procedures (including critical care time) Labs Review Labs Reviewed  WET PREP, GENITAL - Abnormal; Notable for the following:    Clue Cells Wet Prep HPF POC PRESENT (*)    WBC, Wet Prep HPF POC FEW (*)    All other components within normal limits  COMPREHENSIVE METABOLIC PANEL - Abnormal; Notable for the following:    BUN <5 (*)    ALT 11 (*)    All other components within normal limits  CBC - Abnormal; Notable for the following:    WBC 11.9 (*)    All other components within normal limits  URINALYSIS, ROUTINE W REFLEX MICROSCOPIC (NOT AT [facility]) - Abnormal; Notable for the following:    Hgb urine dipstick LARGE (*)    Ketones, ur 40 (*)    All other components within normal limits  URINE MICROSCOPIC-ADD ON - Abnormal; Notable for the following:    Squamous Epithelial / LPF 0-5 (*)    Bacteria, UA RARE (*)    All other components within normal limits  I-STAT BETA HCG BLOOD, ED ([facility], [facility], [facility] ONLY) - Abnormal; Notable for the following:    I-stat hCG, quantitative >2000.0 (*)    All other components within normal limits  LIPASE, BLOOD  RAPID HIV SCREEN (HIV 1/2 AB+AG)  RPR  ABO/RH  GC/CHLAMYDIA PROBE AMP ([place]) NOT AT [facility]    Imaging Review US Ob Comp Less 14 Wks  07/04/2015  CLINICAL DATA:  Vaginal bleeding and abdominal pain. Quantitative beta HCG greater than 2000. EXAM: OBSTETRIC <14 WK [place] AND TRANSVAGINAL OB US TECHNIQUE: Both transabdominal and transvaginal ultrasound examinations were performed for complete evaluation of the gestation as well as the maternal uterus, adnexal regions, and pelvic cul-de-sac. Transvaginal technique was performed to assess early pregnancy. COMPARISON:  None. FINDINGS:  Intrauterine gestational sac: Not visualized. Yolk sac:  Not visualized. Embryo:  Not visualized. Cardiac Activity: Not visualized. Heart Rate: Not visualized. Maternal uterus/adnexae: Uterus normal in size, shape and position. Endometrium measures 13.7 mm in thickness. Left ovary is within normal measuring 1.8 x 3.4 x 6.5 cm. Normal color flow to the left ovary. Right ovary is not accurately identified as there is a heterogeneous ill-defined mass in the right adnexa measuring 5.5 x 6.9 x 8.5 cm with mild peripheral vascularity. Moderate amount of complex free pelvic fluid. IMPRESSION: No evidence of intrauterine gestation. Large heterogeneous mass in the right adnexa measuring 5.5 x 6.9 x 8.5 cm concerning for tubal ectopic pregnancy. Right ovary not accurately identified. Moderate amount of associated complex pelvic fluid/hemorrhage. Critical Value/emergent results were called by telephone at the time of interpretation on 07/04/2015 at 9:19 pm to Dr. Fayrene Helper , who verbally acknowledged these results. Electronically Signed   By: Elberta Fortis M.D.   On: 07/04/2015 21:19   US Ob Transvaginal  07/04/2015  CLINICAL DATA:  Vaginal bleeding and abdominal pain. Quantitative beta HCG greater than 2000. EXAM: OBSTETRIC <14 WK US AND TRANSVAGINAL OB US TECHNIQUE: Both transabdominal and transvaginal ultrasound examinations were performed for complete evaluation of the gestation as well as the maternal uterus, adnexal regions, and pelvic cul-de-sac. Transvaginal technique was performed to assess early pregnancy. COMPARISON:  None. FINDINGS: Intrauterine gestational sac: Not visualized. Yolk sac:  Not visualized. Embryo:  Not visualized. Cardiac Activity: Not visualized. Heart Rate: Not visualized. Maternal uterus/adnexae: Uterus normal in size, shape and position. Endometrium measures 13.7 mm in thickness. Left ovary is within normal measuring 1.8 x 3.4 x 6.5 cm. Normal color flow to the left ovary. Right ovary is not  accurately identified as there is a heterogeneous ill-defined mass in the right adnexa measuring 5.5 x 6.9 x 8.5 cm with mild peripheral vascularity. Moderate amount of complex free pelvic fluid. IMPRESSION: No evidence of intrauterine gestation. Large heterogeneous mass in the right adnexa measuring 5.5 x 6.9 x 8.5 cm concerning for tubal ectopic pregnancy. Right ovary not accurately identified. Moderate amount of associated complex pelvic fluid/hemorrhage. Critical Value/emergent results were called by telephone at the time of interpretation on 07/04/2015 at 9:19 pm to Dr. Fayrene HelperBOWIE Lyrick Worland , who verbally acknowledged these results. Electronically Signed   By: Elberta Fortisaniel  Boyle M.D.   On: 07/04/2015 21:19   I have personally reviewed and evaluated these images and lab results as part of my medical decision-making.   EKG Interpretation None      MDM   Final diagnoses:  Ectopic pregnancy, tubal   BP 132/92 mmHg  Pulse 74  Temp(Src) 98.8 F (37.1 C) (Oral)  Resp 18  SpO2 100%   5:57 PM Patient presents with low abdominal cramping, vaginal bleeding, along with persistence emesis. Her quantitative hCG is greater than 2000 consistence with pregnancy. She did report vaginal bleeding therefore I'm concerned of threatened miscarriage vs ectopic pregnancy. I will perform pelvic examination follow with transvaginal ultrasound for further evaluation. Pain medication, antinausea medication and IV fluid given.  9:21 PM Radiologists notify finding of transvaginal ultrasound concerning for ectopic pregnancy. Pt made aware of finding.  I will consult pt's OBGYN Dr. Hulan FessJames Tomlin ([facility]) for further management.  Care discussed with DR. Adela LankFLoyd.    10:00 PM Appreciate consultation from oncall OBGYN, Dr. Zelphia CairoGretchen Adkins who request [facility] management. She also request for a specific quant.  I will call our labs to ensure we get a quantitative HCG.  Pt currently  hemodynamically stable.  Pain is controlled.  She is stable for transfer.  Pt is also aware of plan and agrees with plan.    CRITICAL CARE Performed by: Fayrene HelperRAN,Taris Galindo Total critical care time: 45 minutes Critical care time was exclusive of separately billable procedures and treating other patients. Critical care was necessary to treat or prevent imminent or life-threatening deterioration. Critical care was time spent personally by me on the following activities: development of treatment plan with patient and/or surrogate as well as nursing, discussions with consultants, evaluation of patient's response to treatment, examination of patient, obtaining history from patient or surrogate, ordering and performing treatments and interventions, ordering and review of laboratory studies, ordering and review of radiographic studies, pulse oximetry and re-evaluation of patient's condition.   Fayrene HelperBowie Harlowe Dowler, PA-C 07/04/15 2222  Melene Planan Floyd, DO 07/04/15 2243  Fayrene HelperBowie Sherleen Pangborn, PA-C 07/04/15 2246  Melene Planan Floyd, DO 07/04/15 2248

## 2015-07-04 NOTE — MAU Note (Signed)
[facility] via [facility]. Pt has saline lock in left antecubital . Reports lower abd pain that is generalized but L>R. Denies vaginal bleeding. Known ectopic.

## 2015-07-05 ENCOUNTER — Inpatient Hospital Stay (HOSPITAL_COMMUNITY): Payer: BLUE CROSS/BLUE SHIELD | Admitting: Anesthesiology

## 2015-07-05 ENCOUNTER — Inpatient Hospital Stay (HOSPITAL_COMMUNITY): Payer: BLUE CROSS/BLUE SHIELD

## 2015-07-05 ENCOUNTER — Encounter (HOSPITAL_COMMUNITY): Admission: EM | Disposition: A | Discharge status: Home / Self Care | Payer: Self-pay | Source: Home / Self Care

## 2015-07-05 ENCOUNTER — Encounter (HOSPITAL_COMMUNITY): Payer: Self-pay | Admitting: *Deleted

## 2015-07-05 HISTORY — PX: DIAGNOSTIC LAPAROSCOPY WITH REMOVAL OF ECTOPIC PREGNANCY: SHX6449

## 2015-07-05 LAB — CBC
HEMATOCRIT: 33.6 % — AB (ref 36.0–46.0)
HEMOGLOBIN: 11.5 g/dL — AB (ref 12.0–15.0)
MCH: 31.7 pg (ref 26.0–34.0)
MCHC: 34.2 g/dL (ref 30.0–36.0)
MCV: 92.6 fL (ref 78.0–100.0)
Platelets: 319 10*3/uL (ref 150–400)
RBC: 3.63 MIL/uL — ABNORMAL LOW (ref 3.87–5.11)
RDW: 12.6 % (ref 11.5–15.5)
WBC: 14.1 10*3/uL — ABNORMAL HIGH (ref 4.0–10.5)

## 2015-07-05 LAB — RPR: RPR Ser Ql: NONREACTIVE

## 2015-07-05 LAB — GC/CHLAMYDIA PROBE AMP (~~LOC~~) NOT AT ARMC
CHLAMYDIA, DNA PROBE: NEGATIVE
Neisseria Gonorrhea: NEGATIVE

## 2015-07-05 SURGERY — LAPAROSCOPY, WITH ECTOPIC PREGNANCY SURGICAL TREATMENT
Anesthesia: General | Site: Abdomen

## 2015-07-05 MED ORDER — OXYCODONE-ACETAMINOPHEN 5-325 MG PO TABS: 1.0000 | ORAL_TABLET | Freq: Four times a day (QID) | ORAL | Status: DC | PRN

## 2015-07-05 MED ORDER — LACTATED RINGERS IR SOLN
Status: DC | PRN
Start: 2015-07-05 — End: 2015-07-05
  Administered 2015-07-05: 3000 mL

## 2015-07-05 MED ORDER — LIDOCAINE HCL (CARDIAC) 20 MG/ML IV SOLN
INTRAVENOUS | Status: DC | PRN
  Administered 2015-07-05: 50 mg via INTRAVENOUS

## 2015-07-05 MED ORDER — MIDAZOLAM HCL 2 MG/2ML IJ SOLN
INTRAMUSCULAR | Status: AC
  Filled 2015-07-05: qty 2

## 2015-07-05 MED ORDER — BUPIVACAINE HCL (PF) 0.25 % IJ SOLN
INTRAMUSCULAR | Status: DC | PRN
  Administered 2015-07-05: 7 mL

## 2015-07-05 MED ORDER — ONDANSETRON HCL 4 MG/2ML IJ SOLN
INTRAMUSCULAR | Status: AC
  Filled 2015-07-05: qty 2

## 2015-07-05 MED ORDER — IBUPROFEN 600 MG PO TABS: 600.0000 mg | ORAL_TABLET | Freq: Four times a day (QID) | ORAL | Status: DC | PRN

## 2015-07-05 MED ORDER — ROCURONIUM BROMIDE 100 MG/10ML IV SOLN
INTRAVENOUS | Status: AC
  Filled 2015-07-05: qty 1

## 2015-07-05 MED ORDER — ROCURONIUM BROMIDE 100 MG/10ML IV SOLN
INTRAVENOUS | Status: DC | PRN
  Administered 2015-07-05: 20 mg via INTRAVENOUS

## 2015-07-05 MED ORDER — OXYCODONE-ACETAMINOPHEN 5-325 MG PO TABS: 2.0000 | ORAL_TABLET | ORAL | Status: DC | PRN

## 2015-07-05 MED ORDER — DEXAMETHASONE SODIUM PHOSPHATE 10 MG/ML IJ SOLN
INTRAMUSCULAR | Status: AC
  Filled 2015-07-05: qty 1

## 2015-07-05 MED ORDER — FENTANYL CITRATE (PF) 250 MCG/5ML IJ SOLN
INTRAMUSCULAR | Status: AC
  Filled 2015-07-05: qty 5

## 2015-07-05 MED ORDER — MIDAZOLAM HCL 5 MG/5ML IJ SOLN
INTRAMUSCULAR | Status: DC | PRN
  Administered 2015-07-05: 2 mg via INTRAVENOUS

## 2015-07-05 MED ORDER — CITRIC ACID-SODIUM CITRATE 334-500 MG/5ML PO SOLN
30.0000 mL | Freq: Once | ORAL | Status: AC
  Administered 2015-07-05: 30 mL via ORAL
  Filled 2015-07-05: qty 15

## 2015-07-05 MED ORDER — OXYCODONE-ACETAMINOPHEN 5-325 MG PO TABS
ORAL_TABLET | ORAL | Status: AC
  Administered 2015-07-05: 1 via ORAL
  Filled 2015-07-05: qty 1

## 2015-07-05 MED ORDER — GLYCOPYRROLATE 0.2 MG/ML IJ SOLN
INTRAMUSCULAR | Status: DC | PRN
  Administered 2015-07-05: 0.6 mg via INTRAVENOUS

## 2015-07-05 MED ORDER — NEOSTIGMINE METHYLSULFATE 10 MG/10ML IV SOLN
INTRAVENOUS | Status: DC | PRN
  Administered 2015-07-05: 4 mg via INTRAVENOUS

## 2015-07-05 MED ORDER — FENTANYL CITRATE (PF) 100 MCG/2ML IJ SOLN
INTRAMUSCULAR | Status: AC
  Filled 2015-07-05: qty 2

## 2015-07-05 MED ORDER — DEXTROSE 5 % IV SOLN
2.0000 g | INTRAVENOUS | Status: AC
  Administered 2015-07-05: 2 g via INTRAVENOUS
  Filled 2015-07-05: qty 2

## 2015-07-05 MED ORDER — PROPOFOL 10 MG/ML IV BOLUS
INTRAVENOUS | Status: DC | PRN
  Administered 2015-07-05: 200 mg via INTRAVENOUS

## 2015-07-05 MED ORDER — SUCCINYLCHOLINE CHLORIDE 20 MG/ML IJ SOLN
INTRAMUSCULAR | Status: AC
  Filled 2015-07-05: qty 1

## 2015-07-05 MED ORDER — SUCCINYLCHOLINE CHLORIDE 200 MG/10ML IV SOSY
PREFILLED_SYRINGE | INTRAVENOUS | Status: DC | PRN
  Administered 2015-07-05: 120 mg via INTRAVENOUS

## 2015-07-05 MED ORDER — FENTANYL CITRATE (PF) 100 MCG/2ML IJ SOLN
INTRAMUSCULAR | Status: DC | PRN
  Administered 2015-07-05: 50 ug via INTRAVENOUS
  Administered 2015-07-05 (×2): 100 ug via INTRAVENOUS

## 2015-07-05 MED ORDER — OXYCODONE-ACETAMINOPHEN 5-325 MG PO TABS
1.0000 | ORAL_TABLET | ORAL | Status: DC | PRN
  Administered 2015-07-05: 1 via ORAL

## 2015-07-05 MED ORDER — PROPOFOL 10 MG/ML IV BOLUS
INTRAVENOUS | Status: AC
  Filled 2015-07-05: qty 20

## 2015-07-05 MED ORDER — LACTATED RINGERS IV SOLN
INTRAVENOUS | Status: DC
  Administered 2015-07-05 (×2): via INTRAVENOUS

## 2015-07-05 MED ORDER — NEOSTIGMINE METHYLSULFATE 10 MG/10ML IV SOLN
INTRAVENOUS | Status: AC
  Filled 2015-07-05: qty 1

## 2015-07-05 MED ORDER — FAMOTIDINE IN NACL 20-0.9 MG/50ML-% IV SOLN
20.0000 mg | Freq: Once | INTRAVENOUS | Status: AC
  Administered 2015-07-05: 20 mg via INTRAVENOUS
  Filled 2015-07-05: qty 50

## 2015-07-05 MED ORDER — GLYCOPYRROLATE 0.2 MG/ML IJ SOLN
INTRAMUSCULAR | Status: AC
  Filled 2015-07-05: qty 3

## 2015-07-05 MED ORDER — DEXAMETHASONE SODIUM PHOSPHATE 4 MG/ML IJ SOLN
INTRAMUSCULAR | Status: DC | PRN
  Administered 2015-07-05: 10 mg via INTRAVENOUS

## 2015-07-05 MED ORDER — LIDOCAINE HCL (CARDIAC) 20 MG/ML IV SOLN
INTRAVENOUS | Status: AC
  Filled 2015-07-05: qty 5

## 2015-07-05 MED ORDER — ONDANSETRON HCL 4 MG/2ML IJ SOLN
INTRAMUSCULAR | Status: DC | PRN
  Administered 2015-07-05: 4 mg via INTRAVENOUS

## 2015-07-05 SURGICAL SUPPLY — 30 items
BAG SPEC RTRVL LRG 6X4 10 (ENDOMECHANICALS) ×1
BARRIER ADHS 3X4 INTERCEED (GAUZE/BANDAGES/DRESSINGS) IMPLANT
BRR ADH 4X3 ABS CNTRL BYND (GAUZE/BANDAGES/DRESSINGS)
CABLE HIGH FREQUENCY MONO STRZ (ELECTRODE) ×1 IMPLANT
CATH ROBINSON RED A/P 16FR (CATHETERS) ×2 IMPLANT
CHLORAPREP W/TINT 26ML (MISCELLANEOUS) ×2 IMPLANT
CLOTH BEACON ORANGE TIMEOUT ST (SAFETY) ×2 IMPLANT
DRSG OPSITE POSTOP 3X4 (GAUZE/BANDAGES/DRESSINGS) ×1 IMPLANT
DURAPREP 26ML APPLICATOR (WOUND CARE) ×1 IMPLANT
GLOVE BIO SURGEON STRL SZ 6.5 (GLOVE) ×2 IMPLANT
GLOVE BIOGEL PI IND STRL 7.0 (GLOVE) ×2 IMPLANT
GLOVE BIOGEL PI INDICATOR 7.0 (GLOVE) ×2
GOWN STRL REUS W/TWL LRG LVL3 (GOWN DISPOSABLE) ×4 IMPLANT
LIQUID BAND (GAUZE/BANDAGES/DRESSINGS) ×2 IMPLANT
NS IRRIG 1000ML POUR BTL (IV SOLUTION) ×2 IMPLANT
PACK LAPAROSCOPY BASIN (CUSTOM PROCEDURE TRAY) ×2 IMPLANT
POUCH SPECIMEN RETRIEVAL 10MM (ENDOMECHANICALS) ×1 IMPLANT
SEALER TISSUE G2 CVD JAW 35 (ENDOMECHANICALS) IMPLANT
SEALER TISSUE G2 CVD JAW 45CM (ENDOMECHANICALS) ×1 IMPLANT
SET IRRIG TUBING LAPAROSCOPIC (IRRIGATION / IRRIGATOR) ×1 IMPLANT
SLEEVE XCEL OPT CAN 5 100 (ENDOMECHANICALS) ×2 IMPLANT
STRIP CLOSURE SKIN 1/2X4 (GAUZE/BANDAGES/DRESSINGS) IMPLANT
SUT VIC AB 3-0 PS2 18 (SUTURE) ×2
SUT VIC AB 3-0 PS2 18XBRD (SUTURE) ×1 IMPLANT
SUT VICRYL 0 UR6 27IN ABS (SUTURE) ×2 IMPLANT
TOWEL OR 17X24 6PK STRL BLUE (TOWEL DISPOSABLE) ×4 IMPLANT
TROCAR OPTI TIP 5M 100M (ENDOMECHANICALS) ×2 IMPLANT
TROCAR XCEL NON-BLD 11X100MML (ENDOMECHANICALS) ×2 IMPLANT
WARMER LAPAROSCOPE (MISCELLANEOUS) ×2 IMPLANT
WATER STERILE IRR 1000ML POUR (IV SOLUTION) ×2 IMPLANT

## 2015-07-05 NOTE — Op Note (Signed)
NAMBridget Logan:  Purdon, Braxtyn             ACCOUNT NO.:  [id number]  MEDICAL RECORD NO.:  [id number]  LOCATION:  [facility]                          FACILITY:  [facility]  PHYSICIAN:  Zelphia CairoGretchen Meaghan Whistler, MD    DATE OF BIRTH:  09-13-1985  DATE OF PROCEDURE:  07/04/2015 DATE OF DISCHARGE:  07/04/2015                              OPERATIVE REPORT   PREOPERATIVE DIAGNOSIS:  Ruptured ectopic pregnancy.  POSTOPERATIVE DIAGNOSIS:  Ruptured ectopic pregnancy.  PROCEDURE:  Laparoscopic left salpingectomy.  SURGEON:  Zelphia CairoGretchen [name], MD  ANESTHESIA:  General.  COMPLICATIONS:  None.  SPECIMEN:  Left fallopian tube.  BLOOD LOSS:  450-500 mL.  DESCRIPTION OF PROCEDURE:  The patient was taken to the operating room after informed consent was obtained.  She was given general anesthesia and placed in the dorsal lithotomy position using Allen stirrups, prepped and draped in sterile fashion and a Foley catheter was inserted sterilely.  Bivalve speculum was placed in the vagina and a Hulka clamp was placed on the cervix.  Speculum was removed and our attention was turned to the abdomen.  Infraumbilical skin incision was made with a scalpel and extended bluntly to the level of fascia using a Kelly clamp. Optical trocar was inserted.  Large amount of bright red blood and clot were noted throughout the abdomen and pelvis.  A suprapubic incision was made and a 5 mm trocar was inserted under direct visualization.  Suction irrigator was used to remove approximately 450 mL of clot and blood from the pelvis.  The right fallopian tube and ovary appeared normal.  The uterus appeared normal, but had dense adhesions to the anterior abdominal wall.  The left ovary was tortuous, edematous with a large tear posteriorly with expelled ectopic pregnancy.  Fallopian tube was actively bleeding from the point of rupture.  Given the extent of damage to the left fallopian tube decision was made for salpingectomy.  Instill device was  inserted through the optical scope and the left fallopian tube was excised.  Excellent hemostasis was noted.  The pelvis was irrigated.  Excellent hemostasis was noted.  The Kleppinger's were used to provide hemostasis.  A small area of oozing along the left pedicle. All instruments and trocars were then removed from the abdomen.  Deep stitches were placed and the skin was closed with Vicryl.  Dermabond was placed over her incisions.  Sponge, lap, needle and instrument counts were correct x2.  She was taken to the recovery room in stable condition.     Zelphia CairoGretchen [name], MD     GA/MEDQ  D:  07/05/2015  T:  07/05/2015  Job:  [id number]

## 2015-07-05 NOTE — Anesthesia Procedure Notes (Signed)
Procedure Name: Intubation Date/Time: 07/05/2015 1:47 AM Performed by: Marrion CoyMERRITT, Damoni Causby R Pre-anesthesia Checklist: Patient identified, Patient being monitored, Timeout performed, Emergency Drugs available and Suction available Patient Re-evaluated:Patient Re-evaluated prior to inductionOxygen Delivery Method: Circle System Utilized and Circle system utilized Preoxygenation: Pre-oxygenation with 100% oxygen Intubation Type: IV induction, Rapid sequence and Cricoid Pressure applied Laryngoscope Size: Mac and 3 Grade View: Grade II Tube type: Oral Tube size: 7.0 mm Number of attempts: 1 Airway Equipment and Method: stylet Placement Confirmation: ETT inserted through vocal cords under direct vision,  positive ETCO2 and breath sounds checked- equal and bilateral Secured at: 21 cm Tube secured with: Tape Dental Injury: Teeth and Oropharynx as per pre-operative assessment

## 2015-07-05 NOTE — Discharge Instructions (Signed)

## 2015-07-05 NOTE — Anesthesia Preprocedure Evaluation (Signed)
Anesthesia Evaluation  Patient identified by MRN, date of birth, ID band Patient awake    Reviewed: Allergy & Precautions, NPO status , Patient's Chart, lab work & pertinent test results  Airway Mallampati: II  TM Distance: >3 FB Neck ROM: Full    Dental no notable dental hx.    Pulmonary asthma ,    Pulmonary exam normal breath sounds clear to auscultation       Cardiovascular negative cardio ROS Normal cardiovascular exam Rhythm:Regular Rate:Normal     Neuro/Psych negative neurological ROS  negative psych ROS   GI/Hepatic negative GI ROS, Neg liver ROS,   Endo/Other  negative endocrine ROS  Renal/GU negative Renal ROS  negative genitourinary   Musculoskeletal negative musculoskeletal ROS (+)   Abdominal   Peds negative pediatric ROS (+)  Hematology negative hematology ROS (+)   Anesthesia Other Findings   Reproductive/Obstetrics (+) Pregnancy                             Anesthesia Physical Anesthesia Plan  ASA: II and emergent  Anesthesia Plan: General   Post-op Pain Management:    Induction: Intravenous, Rapid sequence and Cricoid pressure planned  Airway Management Planned: Oral ETT  Additional Equipment:   Intra-op Plan:   Post-operative Plan: Extubation in OR  Informed Consent: I have reviewed the patients History and Physical, chart, labs and discussed the procedure including the risks, benefits and alternatives for the proposed anesthesia with the patient or authorized representative who has indicated his/her understanding and acceptance.   Dental advisory given  Plan Discussed with: CRNA  Anesthesia Plan Comments:         Anesthesia Quick Evaluation

## 2015-07-05 NOTE — Transfer of Care (Signed)
Immediate Anesthesia Transfer of Care Note  Patient: Kathleen Logan  Procedure(s) Performed: Procedure(s): DIAGNOSTIC LAPAROSCOPY WITH REMOVAL OF ECTOPIC PREGNANCY (N/A)  Patient Location: PACU  Anesthesia Type:General  Level of Consciousness: awake, alert , oriented and patient cooperative  Airway & Oxygen Therapy: Patient Spontanous Breathing and Patient connected to nasal cannula oxygen  Post-op Assessment: Report given to RN, Post -op Vital signs reviewed and stable and Patient moving all extremities X 4  Post vital signs: Reviewed and stable  Last Vitals:  Filed Vitals:   07/04/15 2311 07/04/15 2348  BP: 120/50 129/67  Pulse: 86 84  Temp: 37.1 C 36.8 C  Resp: 16 16    Complications: No apparent anesthesia complications

## 2015-07-05 NOTE — Anesthesia Postprocedure Evaluation (Signed)
Anesthesia Post Note  Patient: Kathleen Logan  Procedure(s) Performed: Procedure(s) (LRB): DIAGNOSTIC LAPAROSCOPY WITH REMOVAL OF ECTOPIC PREGNANCY (N/A)  Patient location during evaluation: PACU Anesthesia Type: General Level of consciousness: awake and alert Pain management: pain level controlled Vital Signs Assessment: post-procedure vital signs reviewed and stable Respiratory status: spontaneous breathing, nonlabored ventilation, respiratory function stable and patient connected to nasal cannula oxygen Cardiovascular status: blood pressure returned to baseline and stable Postop Assessment: no signs of nausea or vomiting Anesthetic complications: no    Last Vitals:  Filed Vitals:   07/05/15 0345 07/05/15 0400  BP: 125/73 129/68  Pulse: 75 75  Temp:    Resp: 17 18    Last Pain:  Filed Vitals:   07/05/15 0414  PainSc: 4                  Phillips Groutarignan, Shylin Keizer

## 2015-07-05 NOTE — H&P (Signed)
30 yo G3P1 with LLQ pain [facility].  Pt reports worsening pain since yesterday.  No f/c, n/v/d.    PMHx:  IBS, asthma PSHx:  c-section 2012, D&E 2016 All:  None Meds:  Proventil, PNV, zofran SHx:  Negative tobacco, etoh, ivdu FHx:  N/c  AF, VSS Gen - uncomfortable, NAD Abd - soft, tender bilateral LQ w/ gaurding & rebound Ext - no edema PV - deferred, [facility]  Hgb 14.5 - now 11.5 PVUS:  Shows ruptured left ectopic w/ clot and free fluid  A/P:  Ruptured left ectopic Plan for laparoscopy with salpingostomy, possible salpingectomy R/b/a discussed with pt, questions answered, informed consent

## 2015-07-06 ENCOUNTER — Encounter (HOSPITAL_COMMUNITY): Payer: Self-pay | Admitting: Obstetrics and Gynecology

## 2015-11-03 LAB — OB RESULTS CONSOLE GC/CHLAMYDIA
Chlamydia: NEGATIVE
GC PROBE AMP, GENITAL: NEGATIVE

## 2015-11-03 LAB — OB RESULTS CONSOLE RUBELLA ANTIBODY, IGM: Rubella: IMMUNE

## 2015-11-03 LAB — OB RESULTS CONSOLE ABO/RH: RH Type: POSITIVE

## 2015-11-03 LAB — OB RESULTS CONSOLE HEPATITIS B SURFACE ANTIGEN: Hepatitis B Surface Ag: NEGATIVE

## 2015-11-03 LAB — OB RESULTS CONSOLE RPR: RPR: NONREACTIVE

## 2015-11-03 LAB — OB RESULTS CONSOLE ANTIBODY SCREEN: Antibody Screen: NEGATIVE

## 2015-11-03 LAB — OB RESULTS CONSOLE HIV ANTIBODY (ROUTINE TESTING): HIV: NONREACTIVE

## 2016-01-10 DIAGNOSIS — Z362 Encounter for other antenatal screening follow-up: Secondary | ICD-10-CM | POA: Diagnosis not present

## 2016-01-10 DIAGNOSIS — Z3A18 18 weeks gestation of pregnancy: Secondary | ICD-10-CM | POA: Diagnosis not present

## 2016-01-21 DIAGNOSIS — Z23 Encounter for immunization: Secondary | ICD-10-CM | POA: Diagnosis not present

## 2016-03-14 DIAGNOSIS — D649 Anemia, unspecified: Secondary | ICD-10-CM | POA: Diagnosis not present

## 2016-03-14 DIAGNOSIS — Z23 Encounter for immunization: Secondary | ICD-10-CM | POA: Diagnosis not present

## 2016-03-14 DIAGNOSIS — Z3480 Encounter for supervision of other normal pregnancy, unspecified trimester: Secondary | ICD-10-CM | POA: Diagnosis not present

## 2016-04-03 NOTE — L&D Delivery Note (Signed)
Delivery Note At 6:48 AM a viable female was delivered via  (Presentation:OA ;  ).  APGAR: , ; weight  .   Placenta status:spont>>intact , .  Cord:  with the following complications: .  Cord pH: not sent  Anesthesia:  loc Episiotomy:  none Lacerations:  Sec deg Suture Repair: 3.0 vicryl rapide Est. Blood Loss (mL):  300  Mom to postpartum.  Baby to Couplet care / Skin to Skin.  Kathleen Logan 05/22/2016, 7:08 AM

## 2016-04-17 DIAGNOSIS — Z36 Encounter for antenatal screening for chromosomal anomalies: Secondary | ICD-10-CM | POA: Diagnosis not present

## 2016-05-08 ENCOUNTER — Encounter (HOSPITAL_COMMUNITY): Payer: Self-pay

## 2016-05-12 ENCOUNTER — Encounter (HOSPITAL_COMMUNITY): Payer: Self-pay

## 2016-05-12 ENCOUNTER — Inpatient Hospital Stay (HOSPITAL_COMMUNITY)
Admission: AD | Admit: 2016-05-12 | Discharge: 2016-05-12 | Disposition: A | Discharge status: Home / Self Care | Payer: BLUE CROSS/BLUE SHIELD | Source: Ambulatory Visit | Attending: Obstetrics and Gynecology | Admitting: Obstetrics and Gynecology

## 2016-05-12 DIAGNOSIS — Z3490 Encounter for supervision of normal pregnancy, unspecified, unspecified trimester: Secondary | ICD-10-CM | POA: Diagnosis not present

## 2016-05-12 DIAGNOSIS — Z3685 Encounter for antenatal screening for Streptococcus B: Secondary | ICD-10-CM | POA: Diagnosis not present

## 2016-05-12 DIAGNOSIS — Z3480 Encounter for supervision of other normal pregnancy, unspecified trimester: Secondary | ICD-10-CM | POA: Diagnosis not present

## 2016-05-12 DIAGNOSIS — Z3A00 Weeks of gestation of pregnancy not specified: Secondary | ICD-10-CM | POA: Diagnosis not present

## 2016-05-12 LAB — OB RESULTS CONSOLE GBS: GBS: NEGATIVE

## 2016-05-12 MED ORDER — PROMETHAZINE HCL 25 MG/ML IJ SOLN
12.5000 mg | Freq: Once | INTRAMUSCULAR | Status: AC
  Administered 2016-05-12: 12.5 mg via INTRAMUSCULAR
  Filled 2016-05-12: qty 1

## 2016-05-12 MED ORDER — BUTORPHANOL TARTRATE 1 MG/ML IJ SOLN
2.0000 mg | Freq: Once | INTRAMUSCULAR | Status: AC
  Administered 2016-05-12: 2 mg via INTRAMUSCULAR
  Filled 2016-05-12: qty 2

## 2016-05-12 NOTE — MAU Note (Signed)
Pt to MAU from office with ctx every two minutes, states she has been contracting for two days but they were more painful this morning.

## 2016-05-22 ENCOUNTER — Inpatient Hospital Stay (HOSPITAL_COMMUNITY)
Admission: AD | Admit: 2016-05-22 | Discharge: 2016-05-24 | DRG: 775 | Disposition: A | Discharge status: Home / Self Care | Payer: BLUE CROSS/BLUE SHIELD | Source: Ambulatory Visit | Attending: Obstetrics and Gynecology | Admitting: Obstetrics and Gynecology

## 2016-05-22 ENCOUNTER — Encounter (HOSPITAL_COMMUNITY): Payer: Self-pay | Admitting: General Practice

## 2016-05-22 DIAGNOSIS — Z3A37 37 weeks gestation of pregnancy: Secondary | ICD-10-CM | POA: Diagnosis not present

## 2016-05-22 DIAGNOSIS — Z3493 Encounter for supervision of normal pregnancy, unspecified, third trimester: Secondary | ICD-10-CM | POA: Diagnosis not present

## 2016-05-22 DIAGNOSIS — R94120 Abnormal auditory function study: Secondary | ICD-10-CM | POA: Diagnosis not present

## 2016-05-22 DIAGNOSIS — Z833 Family history of diabetes mellitus: Secondary | ICD-10-CM

## 2016-05-22 DIAGNOSIS — O34211 Maternal care for low transverse scar from previous cesarean delivery: Secondary | ICD-10-CM | POA: Diagnosis not present

## 2016-05-22 DIAGNOSIS — Z823 Family history of stroke: Secondary | ICD-10-CM | POA: Diagnosis not present

## 2016-05-22 DIAGNOSIS — Z8249 Family history of ischemic heart disease and other diseases of the circulatory system: Secondary | ICD-10-CM

## 2016-05-22 DIAGNOSIS — Z23 Encounter for immunization: Secondary | ICD-10-CM | POA: Diagnosis not present

## 2016-05-22 LAB — RPR: RPR: NONREACTIVE

## 2016-05-22 LAB — CBC
HEMATOCRIT: 32.4 % — AB (ref 36.0–46.0)
Hemoglobin: 11 g/dL — ABNORMAL LOW (ref 12.0–15.0)
MCH: 29.6 pg (ref 26.0–34.0)
MCHC: 34 g/dL (ref 30.0–36.0)
MCV: 87.3 fL (ref 78.0–100.0)
PLATELETS: 297 10*3/uL (ref 150–400)
RBC: 3.71 MIL/uL — ABNORMAL LOW (ref 3.87–5.11)
RDW: 14.4 % (ref 11.5–15.5)
WBC: 10.6 10*3/uL — AB (ref 4.0–10.5)

## 2016-05-22 MED ORDER — ONDANSETRON HCL 4 MG/2ML IJ SOLN: 4.0000 mg | INTRAMUSCULAR | Status: DC | PRN

## 2016-05-22 MED ORDER — SIMETHICONE 80 MG PO CHEW: 80.0000 mg | CHEWABLE_TABLET | ORAL | Status: DC | PRN

## 2016-05-22 MED ORDER — OXYCODONE-ACETAMINOPHEN 5-325 MG PO TABS: 2.0000 | ORAL_TABLET | ORAL | Status: DC | PRN

## 2016-05-22 MED ORDER — BISACODYL 10 MG RE SUPP: 10.0000 mg | Freq: Every day | RECTAL | Status: DC | PRN

## 2016-05-22 MED ORDER — ONDANSETRON HCL 4 MG PO TABS: 4.0000 mg | ORAL_TABLET | ORAL | Status: DC | PRN

## 2016-05-22 MED ORDER — OXYTOCIN 10 UNIT/ML IJ SOLN
INTRAMUSCULAR | Status: AC
  Filled 2016-05-22: qty 1

## 2016-05-22 MED ORDER — COCONUT OIL OIL
1.0000 "application " | TOPICAL_OIL | Status: DC | PRN
  Administered 2016-05-24: 1 via TOPICAL
  Filled 2016-05-22: qty 120

## 2016-05-22 MED ORDER — DIBUCAINE 1 % RE OINT: 1.0000 "application " | TOPICAL_OINTMENT | RECTAL | Status: DC | PRN

## 2016-05-22 MED ORDER — LACTATED RINGERS IV SOLN
INTRAVENOUS | Status: DC
  Administered 2016-05-22: 07:00:00 via INTRAVENOUS

## 2016-05-22 MED ORDER — IBUPROFEN 800 MG PO TABS
800.0000 mg | ORAL_TABLET | Freq: Three times a day (TID) | ORAL | Status: DC | PRN
  Administered 2016-05-22 – 2016-05-24 (×6): 800 mg via ORAL
  Filled 2016-05-22 (×7): qty 1

## 2016-05-22 MED ORDER — ONDANSETRON HCL 4 MG/2ML IJ SOLN: 4.0000 mg | Freq: Four times a day (QID) | INTRAMUSCULAR | Status: DC | PRN

## 2016-05-22 MED ORDER — FLEET ENEMA 7-19 GM/118ML RE ENEM
1.0000 | ENEMA | Freq: Every day | RECTAL | Status: DC | PRN
Start: 2016-05-22 — End: 2016-05-24

## 2016-05-22 MED ORDER — LIDOCAINE HCL (PF) 1 % IJ SOLN
30.0000 mL | INTRAMUSCULAR | Status: AC | PRN
  Administered 2016-05-22: 30 mL via SUBCUTANEOUS
  Filled 2016-05-22: qty 30

## 2016-05-22 MED ORDER — MEASLES, MUMPS & RUBELLA VAC ~~LOC~~ INJ
0.5000 mL | INJECTION | Freq: Once | SUBCUTANEOUS | Status: DC
  Filled 2016-05-22: qty 0.5

## 2016-05-22 MED ORDER — ZOLPIDEM TARTRATE 5 MG PO TABS: 5.0000 mg | ORAL_TABLET | Freq: Every evening | ORAL | Status: DC | PRN

## 2016-05-22 MED ORDER — BENZOCAINE-MENTHOL 20-0.5 % EX AERO
1.0000 "application " | INHALATION_SPRAY | CUTANEOUS | Status: DC | PRN
  Administered 2016-05-22 – 2016-05-24 (×2): 1 via TOPICAL
  Filled 2016-05-22 (×2): qty 56

## 2016-05-22 MED ORDER — OXYCODONE-ACETAMINOPHEN 5-325 MG PO TABS
1.0000 | ORAL_TABLET | ORAL | Status: DC | PRN
Start: 2016-05-22 — End: 2016-05-22
  Administered 2016-05-22: 1 via ORAL
  Filled 2016-05-22: qty 1

## 2016-05-22 MED ORDER — OXYTOCIN BOLUS FROM INFUSION
500.0000 mL | Freq: Once | INTRAVENOUS | Status: AC
  Administered 2016-05-22: 500 mL via INTRAVENOUS

## 2016-05-22 MED ORDER — SENNOSIDES-DOCUSATE SODIUM 8.6-50 MG PO TABS
2.0000 | ORAL_TABLET | ORAL | Status: DC
  Administered 2016-05-22 – 2016-05-23 (×2): 2 via ORAL
  Filled 2016-05-22 (×2): qty 2

## 2016-05-22 MED ORDER — ACETAMINOPHEN 325 MG PO TABS: 650.0000 mg | ORAL_TABLET | ORAL | Status: DC | PRN

## 2016-05-22 MED ORDER — WITCH HAZEL-GLYCERIN EX PADS: 1.0000 "application " | MEDICATED_PAD | CUTANEOUS | Status: DC | PRN

## 2016-05-22 MED ORDER — DIPHENHYDRAMINE HCL 25 MG PO CAPS: 25.0000 mg | ORAL_CAPSULE | Freq: Four times a day (QID) | ORAL | Status: DC | PRN

## 2016-05-22 MED ORDER — VITAMIN K1 1 MG/0.5ML IJ SOLN
INTRAMUSCULAR | Status: AC
  Filled 2016-05-22: qty 0.5

## 2016-05-22 MED ORDER — BUTORPHANOL TARTRATE 1 MG/ML IJ SOLN
1.0000 mg | Freq: Once | INTRAMUSCULAR | Status: AC
  Administered 2016-05-22: 1 mg via INTRAVENOUS
  Filled 2016-05-22: qty 1

## 2016-05-22 MED ORDER — ACETAMINOPHEN 325 MG PO TABS
650.0000 mg | ORAL_TABLET | ORAL | Status: DC | PRN
  Administered 2016-05-24: 650 mg via ORAL
  Filled 2016-05-22: qty 2

## 2016-05-22 MED ORDER — SOD CITRATE-CITRIC ACID 500-334 MG/5ML PO SOLN: 30.0000 mL | ORAL | Status: DC | PRN

## 2016-05-22 MED ORDER — TETANUS-DIPHTH-ACELL PERTUSSIS 5-2.5-18.5 LF-MCG/0.5 IM SUSP: 0.5000 mL | Freq: Once | INTRAMUSCULAR | Status: DC

## 2016-05-22 MED ORDER — OXYCODONE-ACETAMINOPHEN 5-325 MG PO TABS
1.0000 | ORAL_TABLET | ORAL | Status: DC | PRN
  Administered 2016-05-22 – 2016-05-23 (×2): 1 via ORAL
  Filled 2016-05-22 (×2): qty 1

## 2016-05-22 MED ORDER — OXYTOCIN 40 UNITS IN LACTATED RINGERS INFUSION - SIMPLE MED
2.5000 [IU]/h | INTRAVENOUS | Status: DC
  Administered 2016-05-22: 2.5 [IU]/h via INTRAVENOUS

## 2016-05-22 MED ORDER — OXYTOCIN 40 UNITS IN LACTATED RINGERS INFUSION - SIMPLE MED
INTRAVENOUS | Status: AC
  Administered 2016-05-22: 2.5 [IU]/h via INTRAVENOUS
  Filled 2016-05-22: qty 1000

## 2016-05-22 MED ORDER — PRENATAL MULTIVITAMIN CH
1.0000 | ORAL_TABLET | Freq: Every day | ORAL | Status: DC
  Administered 2016-05-23: 1 via ORAL
  Filled 2016-05-22: qty 1

## 2016-05-22 MED ORDER — LACTATED RINGERS IV SOLN: 500.0000 mL | INTRAVENOUS | Status: DC | PRN

## 2016-05-22 MED ORDER — FLEET ENEMA 7-19 GM/118ML RE ENEM: 1.0000 | ENEMA | RECTAL | Status: DC | PRN

## 2016-05-22 NOTE — H&P (Signed)
Kathleen Logan is a 31 y.o. female presenting for SOL. OB History    Gravida Para Term Preterm AB Living   4 1 1  0 2 1   SAB TAB Ectopic Multiple Live Births   1 0 1 0 1     Past Medical History:  Diagnosis Date  . IBS (irritable bowel syndrome)   . No pertinent past medical history    Past Surgical History:  Procedure Laterality Date  . CESAREAN SECTION  03/29/2011   Procedure: CESAREAN SECTION;  Surgeon: Brock Badharles A Harper, MD;  Location: [facility];  Service: Gynecology;  Laterality: N/A;  Primary Cesarean Section Delivery Girl @ [phone], Apgars 8/9  . DIAGNOSTIC LAPAROSCOPY WITH REMOVAL OF ECTOPIC PREGNANCY N/A 07/05/2015   Procedure: DIAGNOSTIC LAPAROSCOPY WITH REMOVAL OF ECTOPIC PREGNANCY;  Surgeon: Zelphia CairoGretchen Adkins, MD;  Location: [facility];  Service: Gynecology;  Laterality: N/A;  . NO PAST SURGERIES     Family History: family history includes Arthritis in her maternal aunt, maternal uncle, paternal aunt, and paternal uncle; Cancer in her maternal aunt and paternal grandfather; Diabetes in her maternal grandmother and paternal grandmother; Hypertension in her maternal grandmother, mother, and paternal grandmother; Stroke in her maternal aunt. Social History:  reports that she has never smoked. She has never used smokeless tobacco. She reports that she does not drink alcohol or use drugs.     Maternal Diabetes: No Genetic Screening: Normal Maternal Ultrasounds/Referrals: Normal Fetal Ultrasounds or other Referrals:  None Maternal Substance Abuse:  No Significant Maternal Medications:  None Significant Maternal Lab Results:  None Other Comments:  None  ROS History Dilation: 10 Effacement (%): 100 Station: +1 Exam by:: ConsecoErin Davis  Pulse 98, temperature 97.6 F (36.4 C), temperature source Oral, resp. rate 18, height 5' 5.4" (1.661 m), weight 82.1 kg (181 lb), unknown if currently breastfeeding. Exam Physical Exam  Constitutional: She appears well-developed and well-nourished.   HENT:  Head: Normocephalic and atraumatic.  Neck: Normal range of motion. Neck supple.  Cardiovascular: Normal rate and regular rhythm.   Respiratory: Effort normal and breath sounds normal.  GI:  Term FH, [id number]  Genitourinary:  Genitourinary Comments: 9/C/vtx  Musculoskeletal: Normal range of motion.  Neurological: She is alert.    Prenatal labs: ABO, Rh: --/--/O POS (04/02 1813) Antibody:   Rubella:   RPR: Non Reactive (04/02 1813)  HBsAg:    HIV:    GBS:     Assessment/Plan: Term iup, advanced labor, VBAC>>consent signed   Meriel PicaHOLLAND,Tonnette Zwiebel M 05/22/2016, 7:06 AM

## 2016-05-22 NOTE — Lactation Note (Signed)
This note was copied from a baby's chart. Lactation Consultation Note  Patient Name: Kathleen Logan NWGNF'AToday's Date: 05/22/2016 Reason for consult: Initial assessment   With this experienced breast feeding mom and term baby, 37 5/[redacted] weeks  Gestation, and now 7 hours old. Mom states he has breast fed twice so far, and at this time, is asleep skin to skin with mom. Basic breast feeding teaching reviewed with mom , and lactation services also reviewed. Mom states she will call for lactation as needed, and feels comfortable with breast feeding her baby.    Maternal Data Formula Feeding for Exclusion: No Has patient been taught Hand Expression?: Yes Does the patient have breastfeeding experience prior to this delivery?: Yes  Feeding    LATCH Score/Interventions    Audible Swallowing:  (easily expressed colostrum)  Type of Nipple: Everted at rest and after stimulation              Lactation Tools Discussed/Used     Consult Status Consult Status: PRN Follow-up type: Call as needed    Kathleen Logan, Kathleen Logan 05/22/2016, 2:19 PM

## 2016-05-22 NOTE — Progress Notes (Signed)
Notified of pt arrival in MAU with advanced dilation and requested MD to delivery.

## 2016-05-23 ENCOUNTER — Encounter (HOSPITAL_COMMUNITY): Payer: Self-pay | Admitting: Anesthesiology

## 2016-05-23 LAB — CBC
HCT: 30.9 % — ABNORMAL LOW (ref 36.0–46.0)
Hemoglobin: 10.5 g/dL — ABNORMAL LOW (ref 12.0–15.0)
MCH: 29.9 pg (ref 26.0–34.0)
MCHC: 34 g/dL (ref 30.0–36.0)
MCV: 88 fL (ref 78.0–100.0)
PLATELETS: 288 10*3/uL (ref 150–400)
RBC: 3.51 MIL/uL — AB (ref 3.87–5.11)
RDW: 14.7 % (ref 11.5–15.5)
WBC: 15.1 10*3/uL — AB (ref 4.0–10.5)

## 2016-05-23 NOTE — [facility] Notes (Signed)
Post Partum Day 1 Subjective: no complaints, up ad lib, voiding, tolerating PO and + flatus  Objective: Blood pressure 129/70, pulse 70, temperature 98.4 F (36.9 C), temperature source Oral, resp. rate 18, height 5' 5.4" (1.661 m), weight 181 lb (82.1 kg), SpO2 99 %, unknown if currently breastfeeding.  Physical Exam:  General: alert and cooperative Lochia: appropriate Uterine Fundus: firm Incision: healing well DVT Evaluation: No evidence of DVT seen on physical exam. Negative Homan's sign. No cords or calf tenderness. No significant calf/ankle edema.   Recent Labs  05/22/16 0632 05/23/16 0616  HGB 11.0* 10.5*  HCT 32.4* 30.9*    Assessment/Plan: Plan for discharge tomorrow and Circumcision prior to discharge   LOS: 1 day   Onna Nodal G 05/23/2016, 8:01 AM

## 2016-05-23 NOTE — [facility] Notes (Signed)
Patient wanted to sleep. Infant in nursery

## 2016-05-23 NOTE — Lactation Note (Signed)
This note was copied from a baby's chart. Lactation Consultation Note  Patient Name: Boy Deon PillingJercara Musquiz GMWNU'UToday's Date: 05/23/2016 Reason for consult: Follow-up assessment  Mom reports baby has been cluster feeding earlier this morning then had circumcision. Currently sleeping. Mom trying to rest. Mom reports baby is nursing well. Encouraged Mom to call with latch check later today.  Maternal Data    Feeding    LATCH Score/Interventions                      Lactation Tools Discussed/Used     Consult Status Consult Status: Follow-up Date: 05/24/16 Follow-up type: In-patient    Alfred LevinsGranger, Richel Millspaugh Ann 05/23/2016, 1:53 PM

## 2016-05-24 MED ORDER — OXYCODONE-ACETAMINOPHEN 5-325 MG PO TABS: 1.0000 | ORAL_TABLET | ORAL | 0 refills | Status: DC | PRN

## 2016-05-24 MED ORDER — IBUPROFEN 800 MG PO TABS: 800.0000 mg | ORAL_TABLET | Freq: Three times a day (TID) | ORAL | 0 refills | Status: DC | PRN

## 2016-05-24 NOTE — Discharge Summary (Signed)
Obstetric Discharge Summary Reason for Admission: onset of labor Prenatal Procedures: ultrasound Intrapartum Procedures: spontaneous vaginal delivery Postpartum Procedures: none Complications-Operative and Postpartum: 2 degree perineal laceration Hemoglobin  Date Value Ref Range Status  05/23/2016 10.5 (L) 12.0 - 15.0 g/dL Final   HCT  Date Value Ref Range Status  05/23/2016 30.9 (L) 36.0 - 46.0 % Final    Physical Exam:  General: alert and cooperative Lochia: appropriate Uterine Fundus: firm Incision: healing well DVT Evaluation: No evidence of DVT seen on physical exam. Negative Homan's sign. No cords or calf tenderness. No significant calf/ankle edema.  Discharge Diagnoses: Term Pregnancy-delivered  Discharge Information: Date: 05/24/2016 Activity: pelvic rest Diet: routine Medications: PNV, Ibuprofen and Percocet Condition: stable Instructions: refer to practice specific booklet Discharge to: home   Newborn Data: Live born female  Birth Weight: 7 lb 3.2 oz (3265 g) APGAR: 9, 9  Home with mother.  Jackye Dever G 05/24/2016, 7:57 AM

## 2016-05-24 NOTE — Lactation Note (Signed)
This note was copied from a baby's chart. Lactation Consultation Note:  Mother in side lying position breastfeeding infant when I arrived in room.  Infant suckling with good patten.  Mother ask how long will cluster feeding last.  Mother informed that could last several days. Advised mother to continue to breastfeed well during the day and infant will wake her at night, Mother states that (R) breast is beginning to feel heavy and full.  Mother advised to wake infant and feed well along with good breast massage and ice as needed.  Mother is aware of available Lactation services and community support.   Patient Name: Boy Deon PillingJercara Fischetti WUJWJ'XToday's Date: 05/24/2016 Reason for consult: Follow-up assessment   Maternal Data    Feeding Feeding Type: Breast Fed Length of feed: 40 min  LATCH Score/Interventions Latch: Grasps breast easily, tongue down, lips flanged, rhythmical sucking.  Audible Swallowing: A few with stimulation Intervention(s): Hand expression  Type of Nipple: Everted at rest and after stimulation  Comfort (Breast/Nipple): Soft / non-tender     Hold (Positioning): No assistance needed to correctly position infant at breast.  LATCH Score: 9  Lactation Tools Discussed/Used     Consult Status Consult Status: Complete    Michel BickersKendrick, Shatera Rennert McCoy 05/24/2016, 9:29 AM

## 2016-05-26 LAB — TYPE AND SCREEN
BLOOD PRODUCT EXPIRATION DATE: 201803142359
Blood Product Expiration Date: 201803172359
Unit Type and Rh: 5100
Unit Type and Rh: 5100

## 2016-06-28 DIAGNOSIS — Z1389 Encounter for screening for other disorder: Secondary | ICD-10-CM | POA: Diagnosis not present

## 2016-07-17 DIAGNOSIS — Z3202 Encounter for pregnancy test, result negative: Secondary | ICD-10-CM | POA: Diagnosis not present

## 2016-07-17 DIAGNOSIS — Z30017 Encounter for initial prescription of implantable subdermal contraceptive: Secondary | ICD-10-CM | POA: Diagnosis not present

## 2017-01-24 DIAGNOSIS — Z23 Encounter for immunization: Secondary | ICD-10-CM | POA: Diagnosis not present

## 2017-07-03 ENCOUNTER — Other Ambulatory Visit: Payer: Self-pay

## 2017-07-03 ENCOUNTER — Encounter (HOSPITAL_COMMUNITY): Payer: Self-pay | Admitting: Emergency Medicine

## 2017-07-03 ENCOUNTER — Emergency Department (HOSPITAL_COMMUNITY): Payer: BLUE CROSS/BLUE SHIELD

## 2017-07-03 ENCOUNTER — Emergency Department (HOSPITAL_COMMUNITY)
Admission: EM | Admit: 2017-07-03 | Discharge: 2017-07-03 | Disposition: A | Discharge status: Home / Self Care | Payer: BLUE CROSS/BLUE SHIELD | Attending: Emergency Medicine | Admitting: Emergency Medicine

## 2017-07-03 DIAGNOSIS — R1111 Vomiting without nausea: Secondary | ICD-10-CM | POA: Diagnosis not present

## 2017-07-03 DIAGNOSIS — N200 Calculus of kidney: Secondary | ICD-10-CM

## 2017-07-03 DIAGNOSIS — R1032 Left lower quadrant pain: Secondary | ICD-10-CM | POA: Diagnosis present

## 2017-07-03 DIAGNOSIS — N202 Calculus of kidney with calculus of ureter: Secondary | ICD-10-CM | POA: Insufficient documentation

## 2017-07-03 LAB — COMPREHENSIVE METABOLIC PANEL
ALT: 15 U/L (ref 14–54)
ANION GAP: 14 (ref 5–15)
AST: 20 U/L (ref 15–41)
Albumin: 4 g/dL (ref 3.5–5.0)
Alkaline Phosphatase: 78 U/L (ref 38–126)
BILIRUBIN TOTAL: 0.5 mg/dL (ref 0.3–1.2)
BUN: 10 mg/dL (ref 6–20)
CALCIUM: 9.3 mg/dL (ref 8.9–10.3)
CO2: 22 mmol/L (ref 22–32)
Chloride: 106 mmol/L (ref 101–111)
Creatinine, Ser: 0.87 mg/dL (ref 0.44–1.00)
GFR calc Af Amer: >60 mL/min (ref >60)
GFR calc non Af Amer: >60 mL/min (ref >60)
Glucose, Bld: 132 mg/dL — ABNORMAL HIGH (ref 65–99)
POTASSIUM: 3.5 mmol/L (ref 3.5–5.1)
Sodium: 142 mmol/L (ref 135–145)
Total Protein: 7.1 g/dL (ref 6.5–8.1)

## 2017-07-03 LAB — URINALYSIS, ROUTINE W REFLEX MICROSCOPIC
BILIRUBIN URINE: NEGATIVE
Glucose, UA: NEGATIVE mg/dL
Ketones, ur: NEGATIVE mg/dL
NITRITE: NEGATIVE
PH: 6 (ref 5.0–8.0)
Protein, ur: 30 mg/dL — AB
SPECIFIC GRAVITY, URINE: 1.021 (ref 1.005–1.030)

## 2017-07-03 LAB — CBC
HEMATOCRIT: 43.6 % (ref 36.0–46.0)
Hemoglobin: 14.3 g/dL (ref 12.0–15.0)
MCH: 30.8 pg (ref 26.0–34.0)
MCHC: 32.8 g/dL (ref 30.0–36.0)
MCV: 93.8 fL (ref 78.0–100.0)
PLATELETS: 308 10*3/uL (ref 150–400)
RBC: 4.65 MIL/uL (ref 3.87–5.11)
RDW: 12.8 % (ref 11.5–15.5)
WBC: 12.6 10*3/uL — AB (ref 4.0–10.5)

## 2017-07-03 LAB — I-STAT BETA HCG BLOOD, ED (MC, WL, AP ONLY): I-stat hCG, quantitative: <5 m[IU]/mL (ref <5)

## 2017-07-03 LAB — LIPASE, BLOOD: Lipase: 28 U/L (ref 11–51)

## 2017-07-03 MED ORDER — KETOROLAC TROMETHAMINE 30 MG/ML IJ SOLN
30.0000 mg | Freq: Once | INTRAMUSCULAR | Status: AC
  Administered 2017-07-03: 30 mg via INTRAVENOUS
  Filled 2017-07-03: qty 1

## 2017-07-03 MED ORDER — CEPHALEXIN 500 MG PO CAPS: 500.0000 mg | ORAL_CAPSULE | Freq: Two times a day (BID) | ORAL | 0 refills | Status: DC

## 2017-07-03 MED ORDER — FENTANYL CITRATE (PF) 100 MCG/2ML IJ SOLN
50.0000 ug | Freq: Once | INTRAMUSCULAR | Status: AC
  Administered 2017-07-03: 50 ug via INTRAVENOUS
  Filled 2017-07-03: qty 2

## 2017-07-03 MED ORDER — SODIUM CHLORIDE 0.9 % IV SOLN
1.0000 g | Freq: Once | INTRAVENOUS | Status: AC
  Administered 2017-07-03: 1 g via INTRAVENOUS
  Filled 2017-07-03: qty 10

## 2017-07-03 MED ORDER — SODIUM CHLORIDE 0.9 % IV SOLN
Freq: Once | INTRAVENOUS | Status: AC
  Administered 2017-07-03: 08:00:00 via INTRAVENOUS

## 2017-07-03 MED ORDER — ONDANSETRON HCL 4 MG/2ML IJ SOLN
4.0000 mg | Freq: Once | INTRAMUSCULAR | Status: AC
  Administered 2017-07-03: 4 mg via INTRAVENOUS
  Filled 2017-07-03: qty 2

## 2017-07-03 MED ORDER — TAMSULOSIN HCL 0.4 MG PO CAPS: 0.4000 mg | ORAL_CAPSULE | Freq: Every day | ORAL | 0 refills | Status: DC

## 2017-07-03 MED ORDER — ONDANSETRON 4 MG PO TBDP: ORAL_TABLET | ORAL | 0 refills | Status: DC

## 2017-07-03 MED ORDER — OXYCODONE-ACETAMINOPHEN 5-325 MG PO TABS: 1.0000 | ORAL_TABLET | ORAL | 0 refills | Status: DC | PRN

## 2017-07-03 NOTE — ED Provider Notes (Signed)
[facility] Provider Note   CSN: [id number] Arrival date & time: 07/03/17  0604     History   Chief Complaint Chief Complaint  Patient presents with  . Abdominal Pain    HPI Kathleen Logan is a 32 y.o. female.  Patient is a 32 year old female with a history of irritable bowel syndrome who presents with abdominal pain.  She states it woke her up from her sleep about 2 AM this morning.  It was sudden onset.  She describes a sharp stabbing pain in her left lower abdomen that radiates to her left flank.  She has had associated nausea and vomiting.  No urinary symptoms.  She denies any vaginal bleeding or discharge.  Her last menstrual cycle was the first part of February.  However she does have Implanon and because of that has irregular periods.  She denies any known fevers.  She is having normal bowel movements.  She denies any history of similar pain although she did have a ruptured ectopic pregnancy which she states felt a little bit like this type of pain.     Past Medical History:  Diagnosis Date  . IBS (irritable bowel syndrome)   . No pertinent past medical history     Patient Active Problem List   Diagnosis Date Noted  . [redacted] weeks gestation of pregnancy   . Abnormal cytological finding on antenatal screening of mother   . Suspected fetal chromosome anomaly affecting antepartum care of mother   . Hyperemesis affecting pregnancy, antepartum 05/27/2014  . Pregnancy 03/30/2014  . S/P cesarean section 03/29/2011    Past Surgical History:  Procedure Laterality Date  . CESAREAN SECTION  03/29/2011   Procedure: CESAREAN SECTION;  Surgeon: Brock Badharles A Harper, MD;  Location: [facility];  Service: Gynecology;  Laterality: N/A;  Primary Cesarean Section Delivery Girl @ [phone], Apgars 8/9  . DIAGNOSTIC LAPAROSCOPY WITH REMOVAL OF ECTOPIC PREGNANCY N/A 07/05/2015   Procedure: DIAGNOSTIC LAPAROSCOPY WITH REMOVAL OF ECTOPIC PREGNANCY;  Surgeon: Zelphia CairoGretchen  Adkins, MD;  Location: [facility];  Service: Gynecology;  Laterality: N/A;  . NO PAST SURGERIES       OB History    Gravida  4   Para  2   Term  2   Preterm  0   AB  2   Living  2     SAB  1   TAB  0   Ectopic  1   Multiple  0   Live Births  2            Home Medications    Prior to Admission medications   Medication Sig Start Date End Date Taking? Authorizing Provider  acetaminophen (TYLENOL) 500 MG tablet Take 500 mg by mouth every 6 (six) hours as needed for mild pain.   Yes [provider]  albuterol (PROVENTIL HFA;VENTOLIN HFA) 108 (90 BASE) MCG/ACT inhaler Inhale 2 puffs into the lungs every 6 (six) hours as needed for wheezing or shortness of breath.   Yes [provider]  cephALEXin (KEFLEX) 500 MG capsule Take 1 capsule (500 mg total) by mouth 2 (two) times daily. 07/03/17   Rolan BuccoBelfi, Zakhi Dupre, MD  ibuprofen (ADVIL,MOTRIN) 800 MG tablet Take 1 tablet (800 mg total) by mouth every 8 (eight) hours as needed for moderate pain. Patient not taking: Reported on 07/03/2017 05/24/16   Julio Sicksurtis, Carol, NP  ondansetron [facility](ZOFRAN ODT) 4 MG disintegrating tablet 4mg  ODT q4 hours prn nausea/vomit 07/03/17   Jamirra Curnow,  Shawna Orleans, MD  oxyCODONE-acetaminophen (PERCOCET) 5-325 MG tablet Take 1-2 tablets by mouth every 4 (four) hours as needed. 07/03/17   Rolan Bucco, MD  tamsulosin (FLOMAX) 0.4 MG CAPS capsule Take 1 capsule (0.4 mg total) by mouth daily. 07/03/17   Rolan Bucco, MD    Family History Family History  Problem Relation Age of Onset  . Stroke Maternal Aunt   . Arthritis Maternal Aunt   . Cancer Maternal Aunt   . Diabetes Maternal Grandmother   . Hypertension Maternal Grandmother   . Hypertension Mother   . Arthritis Maternal Uncle   . Arthritis Paternal Aunt   . Arthritis Paternal Uncle   . Diabetes Paternal Grandmother   . Hypertension Paternal Grandmother   . Cancer Paternal Grandfather     Social History Social History   Tobacco Use  . Smoking  status: Never Smoker  . Smokeless tobacco: Never Used  Substance Use Topics  . Alcohol use: No  . Drug use: No     Allergies   Patient has no known allergies.   Review of Systems Review of Systems  Constitutional: Negative for chills, diaphoresis, fatigue and fever.  HENT: Negative for congestion, rhinorrhea and sneezing.   Eyes: Negative.   Respiratory: Negative for cough, chest tightness and shortness of breath.   Cardiovascular: Negative for chest pain and leg swelling.  Gastrointestinal: Positive for abdominal pain, nausea and vomiting. Negative for blood in stool and diarrhea.  Genitourinary: Negative for difficulty urinating, flank pain, frequency and hematuria.  Musculoskeletal: Negative for arthralgias and back pain.  Skin: Negative for rash.  Neurological: Negative for dizziness, speech difficulty, weakness, numbness and headaches.     Physical Exam Updated Vital Signs BP (!) 105/53   Pulse 66   Temp 98.2 F (36.8 C) (Oral)   Resp 16   Ht 5\' 5"  (1.651 m)   Wt 72.6 kg (160 lb)   LMP 05/05/2017   SpO2 97%   BMI 26.63 kg/m   Physical Exam  Constitutional: She is oriented to person, place, and time. She appears well-developed and well-nourished. She appears distressed.  Appears uncomfortable  HENT:  Head: Normocephalic and atraumatic.  Eyes: Pupils are equal, round, and reactive to light.  Neck: Normal range of motion. Neck supple.  Cardiovascular: Normal rate, regular rhythm and normal heart sounds.  Pulmonary/Chest: Effort normal and breath sounds normal. No respiratory distress. She has no wheezes. She has no rales. She exhibits no tenderness.  Abdominal: Soft. Bowel sounds are normal. There is tenderness in the suprapubic area and left lower quadrant. There is no rebound and no guarding.  Also tenderness in the left flank  Musculoskeletal: Normal range of motion. She exhibits no edema.  Lymphadenopathy:    She has no cervical adenopathy.  Neurological:  She is alert and oriented to person, place, and time.  Skin: Skin is warm and dry. No rash noted.  Psychiatric: She has a normal mood and affect.     ED Treatments / Results  Labs (all labs ordered are listed, but only abnormal results are displayed) Labs Reviewed  COMPREHENSIVE METABOLIC PANEL - Abnormal; Notable for the following components:      Result Value   Glucose, Bld 132 (*)    All other components within normal limits  CBC - Abnormal; Notable for the following components:   WBC 12.6 (*)    All other components within normal limits  URINALYSIS, ROUTINE W REFLEX MICROSCOPIC - Abnormal; Notable for the following components:   APPearance  CLOUDY (*)    Hgb urine dipstick MODERATE (*)    Protein, ur 30 (*)    Leukocytes, UA LARGE (*)    Bacteria, UA FEW (*)    Squamous Epithelial / LPF 6-30 (*)    All other components within normal limits  URINE CULTURE  LIPASE, BLOOD  I-STAT BETA HCG BLOOD, ED ([facility], [facility], [facility] ONLY)    EKG None  Radiology Ct Renal Stone Study  Result Date: 07/03/2017 CLINICAL DATA:  Lower abdominal pain with vomiting EXAM: CT ABDOMEN AND PELVIS WITHOUT CONTRAST TECHNIQUE: Multidetector CT imaging of the abdomen and pelvis was performed following the standard protocol without oral or IV contrast. COMPARISON:  None. FINDINGS: Lower chest: Lung bases are clear.  There is a small hiatal hernia. Hepatobiliary: No focal liver lesions are evident on this noncontrast enhanced study. Gallbladder wall is not appreciably thickened. There is no biliary duct dilatation. Pancreas: There is no pancreatic mass or inflammatory focus. Spleen: No splenic lesions are evident. Adrenals/Urinary Tract: Adrenals bilaterally appear normal. There is no renal mass on either side. There is moderate hydronephrosis on the left. There is no appreciable hydronephrosis on the right. There is no intrarenal calculus on either side. There is a calculus at the left ureterovesical junction measuring 4  x 3 mm. No other ureteral calculi are identified. Urinary bladder wall is mildly thickened. Stomach/Bowel: There is no appreciable bowel wall or mesenteric thickening. No evident bowel obstruction. No free air or portal venous air. Vascular/Lymphatic: No abdominal aortic aneurysm. No vascular lesions are evident. There is no appreciable adenopathy in the abdomen or pelvis. Reproductive: Uterus is retroverted.  No pelvic mass. Other: Appendix appears normal. No abscess or ascites is evident in the abdomen or pelvis. There is a small ventral hernia containing only fat. Musculoskeletal: No evident blastic or lytic bone lesions. There is no intramuscular or abdominal wall lesions evident. IMPRESSION: 1. 4 x 3 mm calculus at the left ureterovesical junction with moderate hydronephrosis in ureterectasis on the left. 2.  Urinary bladder wall thickening.  Suspect a degree of cystitis. 3.  No bowel obstruction.  No abscess.  Appendix appears normal. 4.  Small hiatal hernia.  Small ventral hernia containing only fat. 5.  Uterus retroverted. Electronically Signed   By: Bretta Bang III M.D.   On: 07/03/2017 08:35    Procedures Procedures (including critical care time)  Medications Ordered in ED Medications  fentaNYL (SUBLIMAZE) injection 50 mcg (has no administration in time range)  fentaNYL (SUBLIMAZE) injection 50 mcg (50 mcg Intravenous Given 07/03/17 0739)  ondansetron (ZOFRAN) injection 4 mg (4 mg Intravenous Given 07/03/17 0738)  0.9 %  sodium chloride infusion ( Intravenous New Bag/Given 07/03/17 0740)  ketorolac (TORADOL) 30 MG/ML injection 30 mg (30 mg Intravenous Given 07/03/17 0857)  cefTRIAXone (ROCEPHIN) 1 g in sodium chloride 0.9 % 100 mL IVPB (0 g Intravenous Stopped 07/03/17 0927)     Initial Impression / Assessment and Plan / ED Course  I have reviewed the triage vital signs and the nursing notes.  Pertinent labs & imaging results that were available during my care of the patient were reviewed  by me and considered in my medical decision making (see chart for details).     Patient is a 31 year old female who presents with left flank pain.  She has a CT scan which shows a +4 mm distal left ureteral stone at the UVJ.  There is some moderate hydro-.  She does have some signs of infection  in her urine although it does not appear to be a true clean catch.  I discussed this with Dr. Marlou Porch with [facility] who recommends starting the patient on antibiotics and they will follow-up in the office.  Her pain has been controlled.  Her creatinine is normal.  Her other blood work is non-concerning.  She was discharged home in good condition.  She was given prescriptions for Percocet, Zofran, Flomax and Keflex.  She is currently breast-feeding but will pump and dump while she is taking these medications and then start breast-feeding when she is finished.  She was given strict return precautions.  Final Clinical Impressions(s) / ED Diagnoses   Final diagnoses:  Kidney stone    ED Discharge Orders        Ordered    oxyCODONE-acetaminophen (PERCOCET) 5-325 MG tablet  Every 4 hours PRN     07/03/17 1052    ondansetron (ZOFRAN ODT) 4 MG disintegrating tablet     07/03/17 1052    tamsulosin (FLOMAX) 0.4 MG CAPS capsule  Daily     07/03/17 1052    cephALEXin (KEFLEX) 500 MG capsule  2 times daily     07/03/17 1052       Rolan Bucco, MD 07/03/17 1056

## 2017-07-03 NOTE — ED Triage Notes (Signed)
Pt c/o sharp low mid abd pain onset 0200. Emesis x 4, denies diarrhea, denies fever, denies gyn/urinary s/s.

## 2017-07-03 NOTE — Discharge Instructions (Signed)
Do not take these medications while breast-feeding.  Make sure that you still continue to pump and dump.  Make a follow-up appointment with [facility].  [facility] if you have any worsening pain, fevers, vomiting, inability to urinate or other worsening symptoms.

## 2017-07-04 LAB — URINE CULTURE

## 2017-07-18 DIAGNOSIS — Z01419 Encounter for gynecological examination (general) (routine) without abnormal findings: Secondary | ICD-10-CM | POA: Diagnosis not present

## 2017-07-18 DIAGNOSIS — Z6824 Body mass index (BMI) 24.0-24.9, adult: Secondary | ICD-10-CM | POA: Diagnosis not present

## 2017-08-06 DIAGNOSIS — Z0000 Encounter for general adult medical examination without abnormal findings: Secondary | ICD-10-CM | POA: Diagnosis not present

## 2017-12-05 ENCOUNTER — Encounter: Payer: Self-pay | Admitting: Family Medicine

## 2017-12-05 ENCOUNTER — Other Ambulatory Visit: Payer: Self-pay

## 2017-12-05 ENCOUNTER — Ambulatory Visit: Payer: BLUE CROSS/BLUE SHIELD | Admitting: Family Medicine

## 2017-12-05 ENCOUNTER — Ambulatory Visit (INDEPENDENT_AMBULATORY_CARE_PROVIDER_SITE_OTHER): Payer: BLUE CROSS/BLUE SHIELD

## 2017-12-05 VITALS — BP 127/82 | HR 100 | Temp 99.0°F | Resp 20 | Ht 66.77 in | Wt 160.4 lb

## 2017-12-05 DIAGNOSIS — M79641 Pain in right hand: Secondary | ICD-10-CM

## 2017-12-05 DIAGNOSIS — M7989 Other specified soft tissue disorders: Secondary | ICD-10-CM | POA: Diagnosis not present

## 2017-12-05 DIAGNOSIS — S6991XA Unspecified injury of right wrist, hand and finger(s), initial encounter: Secondary | ICD-10-CM | POA: Diagnosis not present

## 2017-12-05 DIAGNOSIS — M19041 Primary osteoarthritis, right hand: Secondary | ICD-10-CM | POA: Diagnosis not present

## 2017-12-05 LAB — POCT CBC
Granulocyte percent: 5.4 %G — AB (ref 37–80)
HCT, POC: 41.5 % (ref 37.7–47.9)
HEMOGLOBIN: 14.2 g/dL (ref 12.2–16.2)
Lymph, poc: 36 — AB (ref 0.6–3.4)
MCH: 31.2 pg (ref 27–31.2)
MCHC: 34.3 g/dL (ref 31.8–35.4)
MCV: 91 fL (ref 80–97)
MID (cbc): 59.5 — AB (ref 0–0.9)
MPV: 6.9 fL (ref 0–99.8)
POC Granulocyte: 0.4 — AB (ref 2–6.9)
POC LYMPH PERCENT: 4.5 %L — AB (ref 10–50)
POC MID %: 3.2 % (ref 0–12)
Platelet Count, POC: 297 10*3/uL (ref 142–424)
RBC: 4.56 M/uL (ref 4.04–5.48)
RDW, POC: 12.8 %
WBC: 9 10*3/uL (ref 4.6–10.2)

## 2017-12-05 LAB — POCT SEDIMENTATION RATE: POCT SED RATE: 5 mm/h (ref 0–22)

## 2017-12-05 MED ORDER — PREDNISONE 20 MG PO TABS: ORAL_TABLET | ORAL | 0 refills | Status: DC

## 2017-12-05 NOTE — Patient Instructions (Addendum)
  Ice and elevate  Wear splint for comfort sake  Continuing to worsen get rechecked in a couple of days, probably before the weekend.  If you get a fever get checked.  Take prednisone 20 mg 3 pills daily for 2 days, then 2 daily for 2 days, then 1 daily for 2 days, then one half daily for 4 days  Take ibuprofen 200 mg 3 to 4 pills 3 times daily as needed for pain  You can actually take some Tylenol also in addition to that if needed for pain, Tylenol 500 mg (acetaminophen) 3 times daily for pain  We are labs to see if this suggests gout or something else causing it.   If you have lab work done today you will be contacted with your lab results within the next 2 weeks.  If you have not heard from [place] then please contact us. The fastest way to get your results is to register for My Chart.   IF you received an x-ray today, you will receive an invoice from [facility]. Please contact [facility] at [phone] with questions or concerns regarding your invoice.   IF you received labwork today, you will receive an invoice from [facility]. Please contact [facility] at [phone] with questions or concerns regarding your invoice.   Our billing staff will not be able to assist you with questions regarding bills from these companies.  You will be contacted with the lab results as soon as they are available. The fastest way to get your results is to activate your My Chart account. Instructions are located on the last page of this paperwork. If you have not heard from [place] regarding the results in 2 weeks, please contact this office.

## 2017-12-05 NOTE — Addendum Note (Signed)
Addended by: Sueanne Margarita on: 12/05/2017 05:07 PM   Modules accepted: Orders

## 2017-12-05 NOTE — Addendum Note (Signed)
Addended by: HOPPER, DAVID H on: 12/05/2017 05:04 PM   Modules accepted: Orders

## 2017-12-05 NOTE — [facility] Notes (Signed)
Patient ID: Kathleen Logan, female    DOB: 05/05/1985  Age: 32 y.o. MRN: [id number]  Chief Complaint  Patient presents with  . Hand Pain    3 days- thumb and middle finger right hand    Subjective:   Lady who has had pain in her right thumb to middle finger area of the right hand for the last 3 days.  Knows of no trauma.  No other changes in life habits.  This just occurred spontaneously and is gotten steadily worse.  She is keyboarding for a living, and cannot use that right hand now except for the fourth and fifth fingers.  The pain is quite severe, hurting from just distal to the wrist all the way to the fingertips.  Current allergies, medications, problem list, past/family and social histories reviewed.  Objective:  BP 127/82   Pulse 100   Temp 99 F (37.2 C) (Oral)   Resp 20   Ht 5' 6.77" (1.696 m)   Wt 160 lb 6.4 oz (72.8 kg)   LMP 11/15/2017 (Approximate)   SpO2 96%   Breastfeeding? Yes   BMI 25.29 kg/m   Obviously swollen right hand in the area described above.  A little darkening of the tissues around the second MCP area no point tenderness in any one place more than the next.  It is all very very tender.  She points to the webspace area between the thumb and index finger as the area of most pain.  Cannot test strength or motion.  Good radial pulse.  Gross sensation is normal.  No family history of gout.  Has not been eating anything suspicious for causing gout.  Assessment & Plan:   Assessment: 1. Pain of right hand   2. Inflammation of right hand joint       Plan: We will get blood work and x-ray of the hand.  No trauma but should look for bony lesions also.  Patient is breast-feeding, but only about once a day at bedtime.  Decided to go and use prednisone.  Orders Placed This Encounter  Procedures  . DG Hand Complete Right    Standing Status:   Future    Number of Occurrences:   1    Standing Expiration Date:   02/05/2019    Order Specific Question:    Reason for Exam (SYMPTOM  OR DIAGNOSIS REQUIRED)    Answer:   swelling and painful right hand    Order Specific Question:   Is patient pregnant?    Answer:   No    Order Specific Question:   Preferred imaging location?    Answer:   External    Order Specific Question:   Radiology Contrast Protocol - do NOT remove file path    Answer:   [email or web address]  . ANA  . Rheumatoid factor  . Comprehensive metabolic panel  . POCT CBC  . POCT SEDIMENTATION RATE    Meds ordered this encounter  Medications  . predniSONE (DELTASONE) 20 MG tablet    Sig: Take 3 pills daily for 2 days, then 2 daily for 2 days, then 1 daily for 2 days, then one half daily for 4 days.  Take after breakfast with food.    Dispense:  14 tablet    Refill:  0         Patient Instructions    Ice and elevate  Wear splint for comfort sake  Continuing to worsen get rechecked in a couple of days, probably before  the weekend.  If you get a fever get checked.  Take prednisone 20 mg 3 pills daily for 2 days, then 2 daily for 2 days, then 1 daily for 2 days, then one half daily for 4 days  Take ibuprofen 200 mg 3 to 4 pills 3 times daily as needed for pain  You can actually take some Tylenol also in addition to that if needed for pain, Tylenol 500 mg (acetaminophen) 3 times daily for pain  We are labs to see if this suggests gout or something else causing it.   If you have lab work done today you will be contacted with your lab results within the next 2 weeks.  If you have not heard from [place] then please contact us. The fastest way to get your results is to register for My Chart.   IF you received an x-ray today, you will receive an invoice from [facility]. Please contact [facility] at [phone] with questions or concerns regarding your invoice.   IF you received labwork today, you will receive an invoice from [place]. Please contact [facility] at  [phone] with questions or concerns regarding your invoice.   Our billing staff will not be able to assist you with questions regarding bills from these companies.  You will be contacted with the lab results as soon as they are available. The fastest way to get your results is to activate your My Chart account. Instructions are located on the last page of this paperwork. If you have not heard from [place] regarding the results in 2 weeks, please contact this office.        Return if symptoms worsen or fail to improve.   Janace Hoard, MD 12/05/2017

## 2017-12-06 LAB — COMPREHENSIVE METABOLIC PANEL
ALBUMIN: 4.2 g/dL (ref 3.5–5.5)
ALT: 11 IU/L (ref 0–32)
AST: 15 IU/L (ref 0–40)
Albumin/Globulin Ratio: 1.7 (ref 1.2–2.2)
Alkaline Phosphatase: 62 IU/L (ref 39–117)
BUN / CREAT RATIO: 10 (ref 9–23)
BUN: 8 mg/dL (ref 6–20)
Bilirubin Total: <0.2 mg/dL (ref 0.0–1.2)
CO2: 22 mmol/L (ref 20–29)
CREATININE: 0.84 mg/dL (ref 0.57–1.00)
Calcium: 9.2 mg/dL (ref 8.7–10.2)
Chloride: 105 mmol/L (ref 96–106)
GFR calc non Af Amer: 93 mL/min/{1.73_m2} (ref >59)
GFR, EST AFRICAN AMERICAN: 107 mL/min/{1.73_m2} (ref >59)
Globulin, Total: 2.5 g/dL (ref 1.5–4.5)
Glucose: 102 mg/dL — ABNORMAL HIGH (ref 65–99)
Potassium: 4.2 mmol/L (ref 3.5–5.2)
Sodium: 141 mmol/L (ref 134–144)
TOTAL PROTEIN: 6.7 g/dL (ref 6.0–8.5)

## 2017-12-06 LAB — RHEUMATOID FACTOR: Rhuematoid fact SerPl-aCnc: <10 IU/mL (ref 0.0–13.9)

## 2017-12-06 LAB — ANA: Anti Nuclear Antibody(ANA): NEGATIVE

## 2018-01-18 DIAGNOSIS — M109 Gout, unspecified: Secondary | ICD-10-CM | POA: Diagnosis not present

## 2018-01-18 DIAGNOSIS — Z23 Encounter for immunization: Secondary | ICD-10-CM | POA: Diagnosis not present

## 2018-12-14 IMAGING — DX DG HAND COMPLETE 3+V*R*
3 series · 3 of 3 positions shown · non-contrast
Comparison: None.

CLINICAL DATA: Swelling and pain fall

EXAM:
RIGHT HAND - COMPLETE 3+ VIEW

[hand pa]
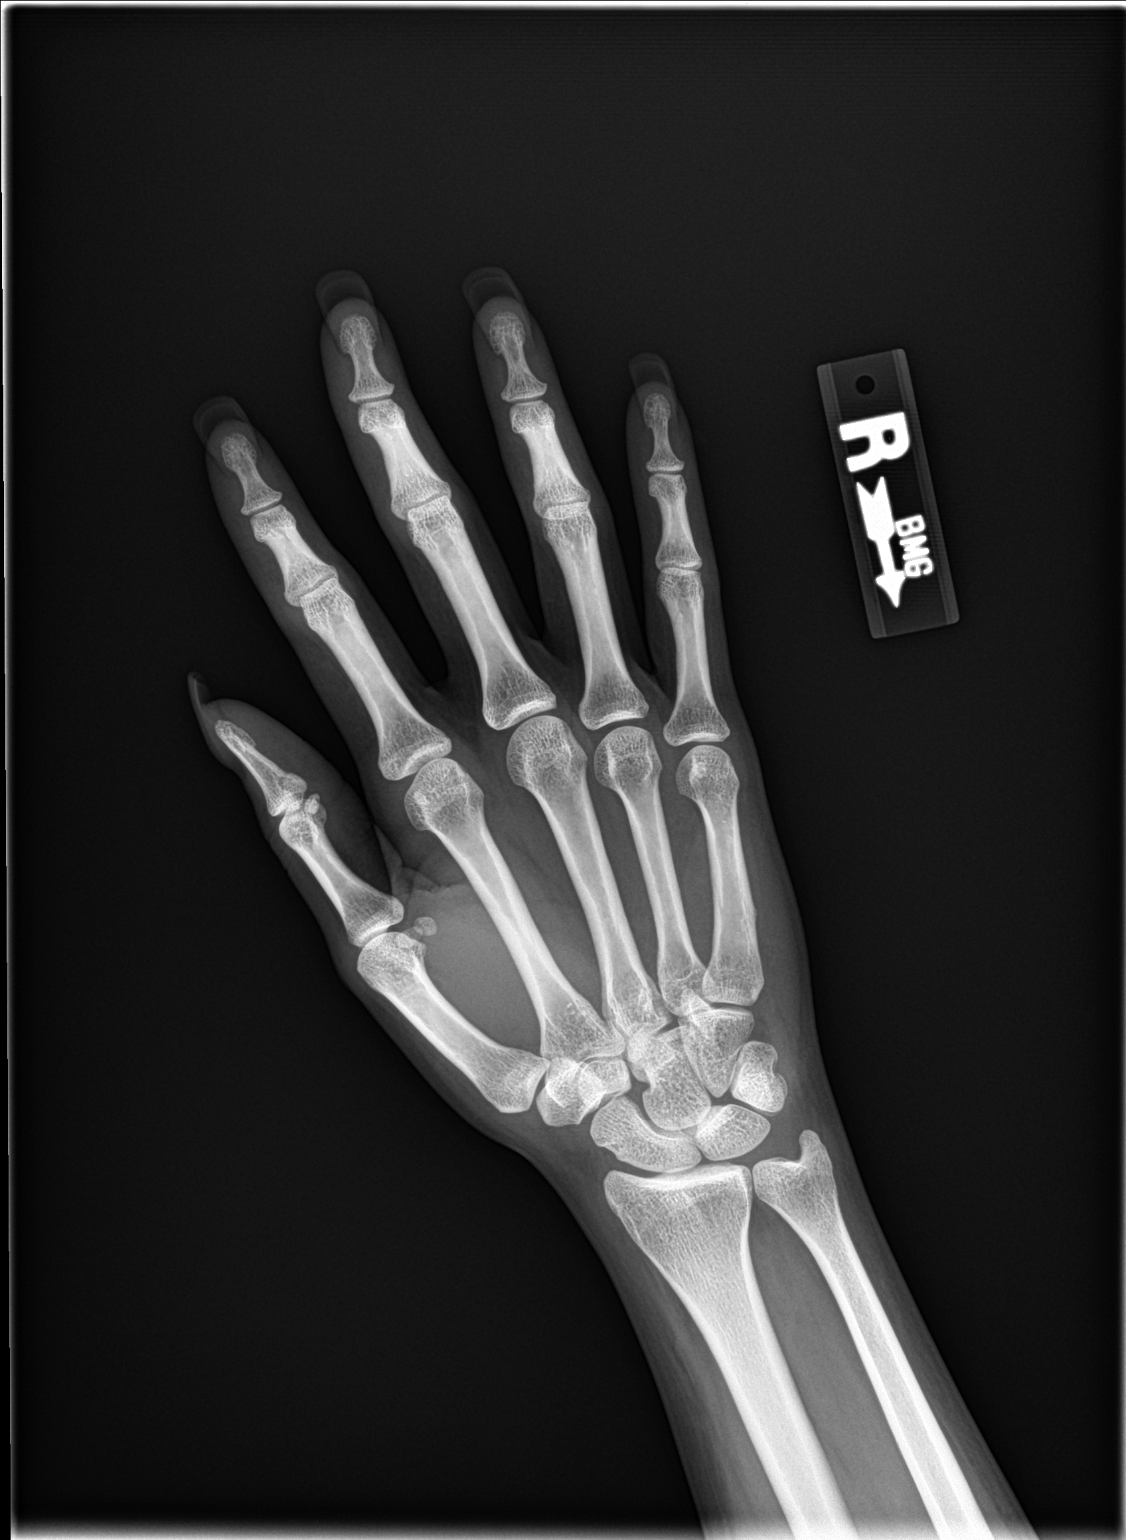

[hand obl]
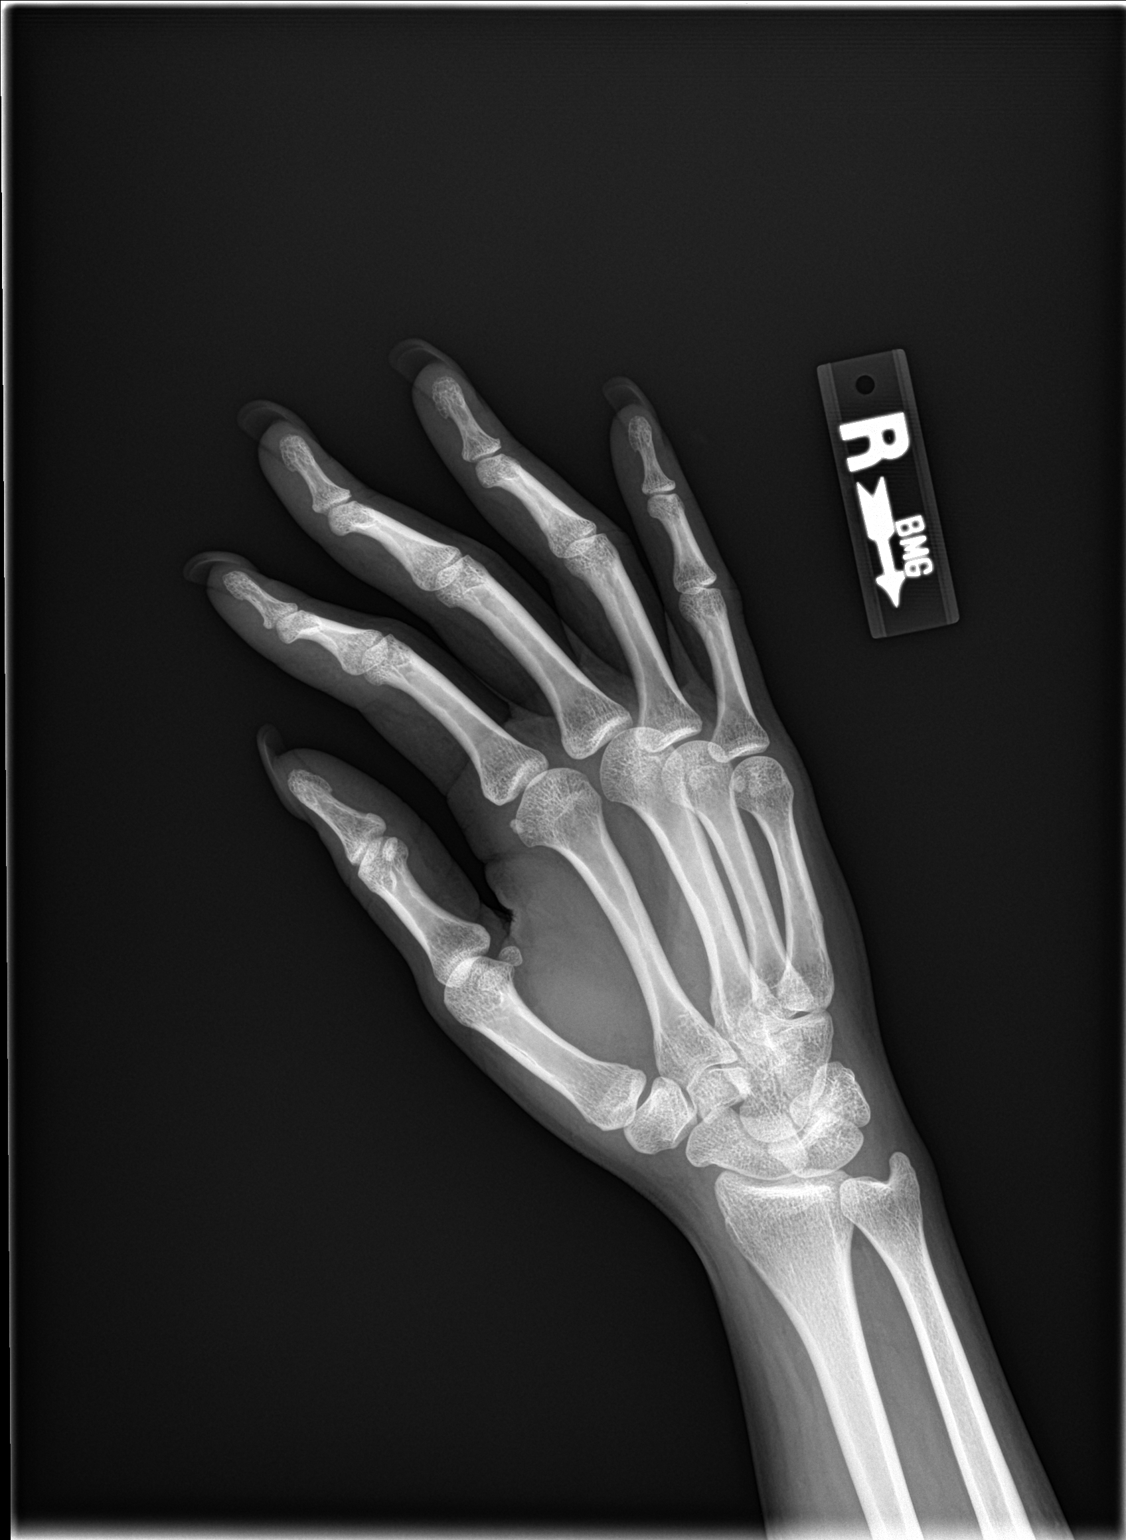

[hand lat]
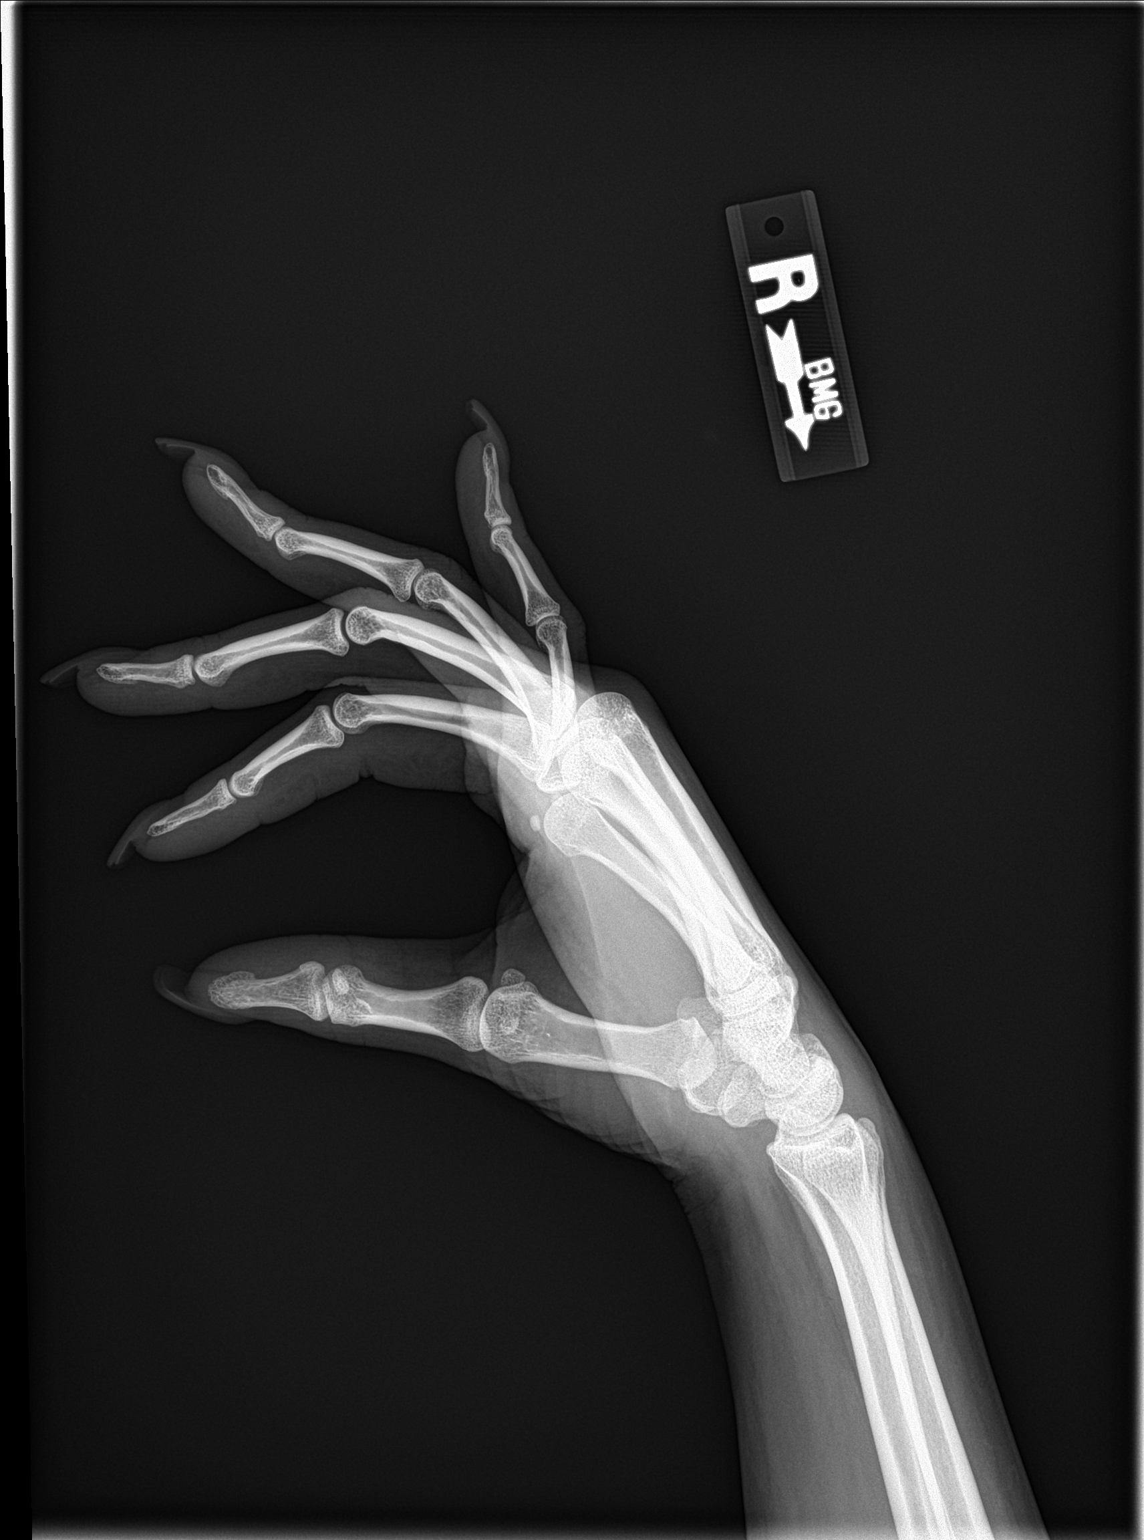

[3 of 3 positions shown; findings below may reference images not displayed]

FINDINGS: There is no evidence of fracture or dislocation. There is no
evidence of arthropathy or other focal bone abnormality. Soft
tissues are unremarkable.
IMPRESSION: Negative.

## 2019-07-23 DIAGNOSIS — Z6825 Body mass index (BMI) 25.0-25.9, adult: Secondary | ICD-10-CM | POA: Diagnosis not present

## 2019-07-23 DIAGNOSIS — Z01419 Encounter for gynecological examination (general) (routine) without abnormal findings: Secondary | ICD-10-CM | POA: Diagnosis not present

## 2019-07-23 DIAGNOSIS — Z3046 Encounter for surveillance of implantable subdermal contraceptive: Secondary | ICD-10-CM | POA: Diagnosis not present

## 2020-11-19 ENCOUNTER — Other Ambulatory Visit: Payer: Self-pay | Admitting: Obstetrics and Gynecology

## 2021-02-10 LAB — OB RESULTS CONSOLE HEPATITIS B SURFACE ANTIGEN: Hepatitis B Surface Ag: NEGATIVE

## 2021-02-10 LAB — OB RESULTS CONSOLE GC/CHLAMYDIA: Neisseria Gonorrhea: NEGATIVE

## 2021-02-10 LAB — OB RESULTS CONSOLE ABO/RH: RH Type: POSITIVE

## 2021-02-10 LAB — OB RESULTS CONSOLE RPR: RPR: NONREACTIVE

## 2021-02-10 LAB — OB RESULTS CONSOLE ANTIBODY SCREEN: Antibody Screen: NEGATIVE

## 2021-02-10 LAB — OB RESULTS CONSOLE RUBELLA ANTIBODY, IGM: Rubella: IMMUNE

## 2021-03-02 LAB — OB RESULTS CONSOLE GC/CHLAMYDIA
Chlamydia: NEGATIVE
Gonorrhea: NEGATIVE

## 2021-04-03 NOTE — L&D Delivery Note (Signed)
Delivery Note/VBAC  SVD viable female Apgars 8,9 over 1st degree ML lac.  Placenta delivered spontaneously intact with 3VC. Repair with 3-0 Chromic with good support and hemostasis noted.  R/V exam confirms.  PH art was not done.   Mother and baby to couplet care and are doing well.  EBL 250cc  Candice Camp, MD

## 2021-06-15 ENCOUNTER — Inpatient Hospital Stay (HOSPITAL_COMMUNITY)
Admission: AD | Admit: 2021-06-15 | Discharge: 2021-06-15 | Disposition: A | Discharge status: Home / Self Care | Payer: BC Managed Care – PPO | Attending: Obstetrics and Gynecology | Admitting: Obstetrics and Gynecology

## 2021-06-15 ENCOUNTER — Inpatient Hospital Stay (HOSPITAL_BASED_OUTPATIENT_CLINIC_OR_DEPARTMENT_OTHER): Payer: BC Managed Care – PPO

## 2021-06-15 ENCOUNTER — Inpatient Hospital Stay (HOSPITAL_COMMUNITY): Payer: BC Managed Care – PPO

## 2021-06-15 ENCOUNTER — Encounter (HOSPITAL_COMMUNITY): Payer: Self-pay | Admitting: Obstetrics and Gynecology

## 2021-06-15 ENCOUNTER — Other Ambulatory Visit: Payer: Self-pay

## 2021-06-15 DIAGNOSIS — R1030 Lower abdominal pain, unspecified: Secondary | ICD-10-CM | POA: Diagnosis not present

## 2021-06-15 DIAGNOSIS — R109 Unspecified abdominal pain: Secondary | ICD-10-CM | POA: Diagnosis present

## 2021-06-15 DIAGNOSIS — O99891 Other specified diseases and conditions complicating pregnancy: Secondary | ICD-10-CM | POA: Insufficient documentation

## 2021-06-15 DIAGNOSIS — M549 Dorsalgia, unspecified: Secondary | ICD-10-CM | POA: Diagnosis not present

## 2021-06-15 DIAGNOSIS — Z3689 Encounter for other specified antenatal screening: Secondary | ICD-10-CM

## 2021-06-15 DIAGNOSIS — Z3A25 25 weeks gestation of pregnancy: Secondary | ICD-10-CM | POA: Diagnosis not present

## 2021-06-15 DIAGNOSIS — N949 Unspecified condition associated with female genital organs and menstrual cycle: Secondary | ICD-10-CM | POA: Diagnosis not present

## 2021-06-15 DIAGNOSIS — R102 Pelvic and perineal pain: Secondary | ICD-10-CM | POA: Insufficient documentation

## 2021-06-15 LAB — URINALYSIS, ROUTINE W REFLEX MICROSCOPIC
Bilirubin Urine: NEGATIVE
Glucose, UA: NEGATIVE mg/dL
Ketones, ur: NEGATIVE mg/dL
Nitrite: NEGATIVE
Protein, ur: NEGATIVE mg/dL
Specific Gravity, Urine: 1.006 (ref 1.005–1.030)
pH: 6 (ref 5.0–8.0)

## 2021-06-15 MED ORDER — CYCLOBENZAPRINE HCL 10 MG PO TABS: 10.0000 mg | ORAL_TABLET | Freq: Three times a day (TID) | ORAL | 1 refills | Status: DC | PRN

## 2021-06-15 MED ORDER — ACETAMINOPHEN 500 MG PO TABS
1000.0000 mg | ORAL_TABLET | Freq: Once | ORAL | Status: AC
  Administered 2021-06-15: 1000 mg via ORAL
  Filled 2021-06-15: qty 2

## 2021-06-15 NOTE — MAU Provider Note (Signed)
Chief Complaint:  Abdominal Pain ? ? Event Date/Time  ? First Provider Initiated Contact with Patient 06/15/21 1742   ?  ?HPI: Kathleen Logan is a 36 y.o. [phone] at [redacted]w[redacted]d who presents to maternity admissions reporting sudden onset severe left sided abdominal pain that originates on the side of her uterus (about the level of the umbilicus) and travels down and across to the pubic bone. Comes in long waves but always starts with a sudden, sharp pain and then slowly eases (like a muscle cramp). Does not feel like her belly is getting tight, is feeling fetal movement. Also feeling sharp lower back pain from her tailbone up her spine to in between her shoulder blades. Both are aggravated by movement. Denies nausea/vomiting, diarrhea/constipation or epigastric pain. Denies vaginal bleeding, leaking of fluid, decreased fetal movement, fever, falls, or recent illness.  ? ?Pregnancy Course: Receives care at [facility], prenatal records reviewed. ? ?Past Medical History:  ?Diagnosis Date  ? IBS (irritable bowel syndrome)   ? No pertinent past medical history   ? ?OB History  ?Gravida Para Term Preterm AB Living  ?5 2 2  0 2 2  ?SAB IAB Ectopic Multiple Live Births  ?1 0 1 0 2  ?  ?# Outcome Date GA Lbr Len/2nd Weight Sex Delivery Anes PTL Lv  ?5 Current           ?4 Term 05/22/16 [redacted]w[redacted]d 02:39 / 00:09 7 lb 3.2 oz (3.265 kg) M VBAC None  LIV  ?3 Term 03/29/11 [redacted]w[redacted]d  7 lb 2.8 oz (3.255 kg) F CS-LTranv Spinal  LIV  ?2 SAB           ?1 Ectopic           ? ?Past Surgical History:  ?Procedure Laterality Date  ? CESAREAN SECTION  03/29/2011  ? Procedure: CESAREAN SECTION;  Surgeon: 03/31/2011, MD;  Location: [facility];  Service: Gynecology;  Laterality: N/A;  Primary Cesarean Section Delivery Girl @ [phone], Apgars 8/9  ? DIAGNOSTIC LAPAROSCOPY WITH REMOVAL OF ECTOPIC PREGNANCY N/A 07/05/2015  ? Procedure: DIAGNOSTIC LAPAROSCOPY WITH REMOVAL OF ECTOPIC PREGNANCY;  Surgeon: 09/04/2015, MD;  Location: [facility];  Service: Gynecology;  Laterality: N/A;  ? DILATION AND CURETTAGE OF UTERUS    ? ?Family History  ?Problem Relation Age of Onset  ? Stroke Maternal Aunt   ? Arthritis Maternal Aunt   ? Cancer Maternal Aunt   ? Diabetes Maternal Grandmother   ? Hypertension Maternal Grandmother   ? Hypertension Mother   ? Arthritis Maternal Uncle   ? Arthritis Paternal Aunt   ? Arthritis Paternal Uncle   ? Diabetes Paternal Grandmother   ? Hypertension Paternal Grandmother   ? Cancer Paternal Grandfather   ? ?Social History  ? ?Tobacco Use  ? Smoking status: Never  ? Smokeless tobacco: Never  ?Vaping Use  ? Vaping Use: Never used  ?Substance Use Topics  ? Alcohol use: Not Currently  ?  Alcohol/week: 2.0 standard drinks  ?  Types: 2 Glasses of wine per week  ?  Comment: occ  ? Drug use: No  ? ?No Known Allergies ?Medications Prior to Admission  ?Medication Sig Dispense Refill Last Dose  ? Prenatal Vit-Fe Fumarate-FA (MULTIVITAMIN-PRENATAL) 27-0.8 MG TABS tablet Take 1 tablet by mouth daily at 12 noon.   06/14/2021  ? acetaminophen (TYLENOL) 500 MG tablet Take 500 mg by mouth every 6 (six) hours as needed for mild pain.     ?  albuterol (PROVENTIL HFA;VENTOLIN HFA) 108 (90 BASE) MCG/ACT inhaler Inhale 2 puffs into the lungs every 6 (six) hours as needed for wheezing or shortness of breath.     ? cephALEXin (KEFLEX) 500 MG capsule Take 1 capsule (500 mg total) by mouth 2 (two) times daily. (Patient not taking: Reported on 12/05/2017) 14 capsule 0   ? ibuprofen (ADVIL,MOTRIN) 800 MG tablet Take 1 tablet (800 mg total) by mouth every 8 (eight) hours as needed for moderate pain. 30 tablet 0   ? ondansetron (ZOFRAN ODT) 4 MG disintegrating tablet 4mg  ODT q4 hours prn nausea/vomit (Patient not taking: Reported on 12/05/2017) 4 tablet 0   ? oxyCODONE-acetaminophen (PERCOCET) 5-325 MG tablet Take 1-2 tablets by mouth every 4 (four) hours as needed. (Patient not taking: Reported on 12/05/2017) 15 tablet 0   ? predniSONE (DELTASONE) 20 MG tablet  Take 3 pills daily for 2 days, then 2 daily for 2 days, then 1 daily for 2 days, then one half daily for 4 days. 14 tablet 0   ? tamsulosin (FLOMAX) 0.4 MG CAPS capsule Take 1 capsule (0.4 mg total) by mouth daily. (Patient not taking: Reported on 12/05/2017) 10 capsule 0   ? ?I have reviewed patient's Past Medical Hx, Surgical Hx, Family Hx, Social Hx, medications and allergies.  ? ?ROS:  ?Review of Systems  ?Constitutional:  Negative for fatigue and fever.  ?HENT:  Negative for congestion and sore throat.   ?Eyes:  Negative for visual disturbance.  ?Respiratory:  Negative for cough, chest tightness and shortness of breath.   ?Cardiovascular:  Negative for chest pain.  ?Gastrointestinal:  Positive for abdominal pain. Negative for constipation, diarrhea, nausea and vomiting.  ?Genitourinary:  Positive for pelvic pain. Negative for difficulty urinating, dysuria, flank pain, hematuria, vaginal bleeding and vaginal discharge.  ?Musculoskeletal:  Positive for back pain.  ?Neurological:  Negative for dizziness, light-headedness and headaches.  ? ?Physical Exam  ?Patient Vitals for the past 24 hrs: ? BP Temp Temp src Pulse Resp SpO2 Height Weight  ?06/15/21 1706 131/72 98.3 ?F (36.8 ?C) Oral (!) 105 20 99 % -- --  ?06/15/21 1702 -- -- -- -- -- -- 5' 5.5" (1.664 m) 182 lb 12.8 oz (82.9 kg)  ? ?Physical Exam ?Vitals and nursing note reviewed.  ?Constitutional:   ?   General: She is not in acute distress. ?   Appearance: She is well-developed and normal weight. She is not ill-appearing.  ?HENT:  ?   Head: Normocephalic and atraumatic.  ?Eyes:  ?   Pupils: Pupils are equal, round, and reactive to light.  ?Cardiovascular:  ?   Rate and Rhythm: Normal rate and regular rhythm.  ?Pulmonary:  ?   Effort: Pulmonary effort is normal.  ?   Breath sounds: Normal breath sounds.  ?Abdominal:  ?   General: There is no distension.  ?   Palpations: Abdomen is soft. There is no shifting dullness.  ?   Tenderness: There is abdominal tenderness  (on left side of uterus from midline to pubic bone).  ?   Hernia: No hernia is present.  ?Genitourinary: ?   Uterus: Normal.   ?Skin: ?   General: Skin is warm and dry.  ?   Capillary Refill: Capillary refill takes less than 2 seconds.  ?Neurological:  ?   Mental Status: She is alert and oriented to person, place, and time.  ?Psychiatric:     ?   Mood and Affect: Mood normal.     ?  Behavior: Behavior normal.  ?  ?Fetal Tracing: reactive ?Baseline: 150 ?Variability: moderate ?Accelerations: 15x15 ?Decelerations: none ?Toco: relaxed ?  ?Labs: ?Results for orders placed or performed during the hospital encounter of 06/15/21 (from the past 24 hour(s))  ?Urinalysis, Routine w reflex microscopic Urine, Clean Catch     Status: Abnormal  ? Collection Time: 06/15/21  5:24 PM  ?Result Value Ref Range  ? Color, Urine YELLOW YELLOW  ? APPearance HAZY (A) CLEAR  ? Specific Gravity, Urine 1.006 1.005 - 1.030  ? pH 6.0 5.0 - 8.0  ? Glucose, UA NEGATIVE NEGATIVE mg/dL  ? Hgb urine dipstick SMALL (A) NEGATIVE  ? Bilirubin Urine NEGATIVE NEGATIVE  ? Ketones, ur NEGATIVE NEGATIVE mg/dL  ? Protein, ur NEGATIVE NEGATIVE mg/dL  ? Nitrite NEGATIVE NEGATIVE  ? Leukocytes,Ua MODERATE (A) NEGATIVE  ? RBC / HPF 0-5 0 - 5 RBC/hpf  ? WBC, UA 0-5 0 - 5 WBC/hpf  ? Bacteria, UA FEW (A) NONE SEEN  ? Squamous Epithelial / LPF 6-10 0 - 5  ? Mucus PRESENT   ? Amorphous Crystal PRESENT   ? Non Squamous Epithelial 0-5 (A) NONE SEEN  ? ?Imaging:  ?US RENAL ? ?Result Date: 06/15/2021 ?CLINICAL DATA:  Left side pain EXAM: RENAL / URINARY TRACT ULTRASOUND COMPLETE COMPARISON:  07/03/2017 CT FINDINGS: Right Kidney: Renal measurements: 11.4 x 4.7 x 5.9 cm = volume: 166 mL. Echogenicity within normal limits. No mass or hydronephrosis visualized. Left Kidney: Renal measurements: 12.9 x 4.7 x 5.7 cm = volume: 182 mL. Echogenicity within normal limits. No mass or hydronephrosis visualized. Bladder: Appears normal for degree of bladder distention. Other: None.  IMPRESSION: No acute findings.  No hydronephrosis. Electronically Signed   By: Charlett NoseKevin  Dover M.D.   On: 06/15/2021 19:30   ? ?MAU Course: ?Orders Placed This Encounter  ?Procedures  ? US MFM OB LIMITED  ?

## 2021-06-15 NOTE — MAU Note (Signed)
.  Kathleen Logan is a 36 y.o. at [redacted]w[redacted]d here in MAU reporting: left sided intermittent abdominal pain that radiates into center of abdomen as well as shooting lower back pain that radiates upward into shoulder blades. ? ?Onset of complaint: 1500 today ?Pain score: abdomen 9/10 & back 8/10 ?Vitals:  ? 06/15/21 1706  ?BP: 131/72  ?Pulse: (!) 105  ?Resp: 20  ?Temp: 98.3 ?F (36.8 ?C)  ?SpO2: 99%  ?   ?FHT: 147 bpm w/ +FM.  No VB or LOF. ?Lab orders placed from triage:   U/A ?

## 2021-06-15 NOTE — Discharge Instructions (Signed)

## 2021-06-30 LAB — OB RESULTS CONSOLE HIV ANTIBODY (ROUTINE TESTING): HIV: NONREACTIVE

## 2021-08-09 ENCOUNTER — Inpatient Hospital Stay (HOSPITAL_COMMUNITY)
Admission: AD | Admit: 2021-08-09 | Discharge: 2021-08-13 | DRG: 831 | Disposition: A | Discharge status: Home / Self Care | Payer: BC Managed Care – PPO | Attending: Obstetrics & Gynecology | Admitting: Obstetrics & Gynecology

## 2021-08-09 ENCOUNTER — Other Ambulatory Visit: Payer: Self-pay

## 2021-08-09 ENCOUNTER — Inpatient Hospital Stay (HOSPITAL_BASED_OUTPATIENT_CLINIC_OR_DEPARTMENT_OTHER): Payer: BC Managed Care – PPO

## 2021-08-09 ENCOUNTER — Encounter (HOSPITAL_COMMUNITY): Payer: Self-pay | Admitting: Obstetrics and Gynecology

## 2021-08-09 DIAGNOSIS — O34219 Maternal care for unspecified type scar from previous cesarean delivery: Secondary | ICD-10-CM | POA: Diagnosis present

## 2021-08-09 DIAGNOSIS — Z3A33 33 weeks gestation of pregnancy: Secondary | ICD-10-CM

## 2021-08-09 DIAGNOSIS — O4690 Antepartum hemorrhage, unspecified, unspecified trimester: Secondary | ICD-10-CM | POA: Diagnosis present

## 2021-08-09 DIAGNOSIS — O4700 False labor before 37 completed weeks of gestation, unspecified trimester: Secondary | ICD-10-CM

## 2021-08-09 DIAGNOSIS — O4693 Antepartum hemorrhage, unspecified, third trimester: Secondary | ICD-10-CM

## 2021-08-09 DIAGNOSIS — Z3A35 35 weeks gestation of pregnancy: Secondary | ICD-10-CM | POA: Diagnosis not present

## 2021-08-09 LAB — URINALYSIS, ROUTINE W REFLEX MICROSCOPIC
Bacteria, UA: NONE SEEN
Bilirubin Urine: NEGATIVE
Glucose, UA: NEGATIVE mg/dL
Ketones, ur: NEGATIVE mg/dL
Leukocytes,Ua: NEGATIVE
Nitrite: NEGATIVE
Protein, ur: NEGATIVE mg/dL
Specific Gravity, Urine: 1.019 (ref 1.005–1.030)
pH: 6 (ref 5.0–8.0)

## 2021-08-09 LAB — CBC
HCT: 34.9 % — ABNORMAL LOW (ref 36.0–46.0)
Hemoglobin: 11.6 g/dL — ABNORMAL LOW (ref 12.0–15.0)
MCH: 30.9 pg (ref 26.0–34.0)
MCHC: 33.2 g/dL (ref 30.0–36.0)
MCV: 93.1 fL (ref 80.0–100.0)
Platelets: 269 10*3/uL (ref 150–400)
RBC: 3.75 MIL/uL — ABNORMAL LOW (ref 3.87–5.11)
RDW: 13.2 % (ref 11.5–15.5)
WBC: 7.9 10*3/uL (ref 4.0–10.5)
nRBC: 0 % (ref 0.0–0.2)

## 2021-08-09 LAB — COMPREHENSIVE METABOLIC PANEL
ALT: 22 U/L (ref 0–44)
AST: 24 U/L (ref 15–41)
Albumin: 2.6 g/dL — ABNORMAL LOW (ref 3.5–5.0)
Alkaline Phosphatase: 166 U/L — ABNORMAL HIGH (ref 38–126)
Anion gap: 6 (ref 5–15)
BUN: 6 mg/dL (ref 6–20)
CO2: 22 mmol/L (ref 22–32)
Calcium: 9.1 mg/dL (ref 8.9–10.3)
Chloride: 109 mmol/L (ref 98–111)
Creatinine, Ser: 0.58 mg/dL (ref 0.44–1.00)
GFR, Estimated: >60 mL/min (ref >60)
Glucose, Bld: 133 mg/dL — ABNORMAL HIGH (ref 70–99)
Potassium: 3.2 mmol/L — ABNORMAL LOW (ref 3.5–5.1)
Sodium: 137 mmol/L (ref 135–145)
Total Bilirubin: 0.4 mg/dL (ref 0.3–1.2)
Total Protein: 6 g/dL — ABNORMAL LOW (ref 6.5–8.1)

## 2021-08-09 LAB — WET PREP, GENITAL
Clue Cells Wet Prep HPF POC: NONE SEEN
Sperm: NONE SEEN
Trich, Wet Prep: NONE SEEN
WBC, Wet Prep HPF POC: >=10 — AB (ref <10)
Yeast Wet Prep HPF POC: NONE SEEN

## 2021-08-09 MED ORDER — LACTATED RINGERS IV BOLUS
1000.0000 mL | Freq: Once | INTRAVENOUS | Status: AC
  Administered 2021-08-09: 1000 mL via INTRAVENOUS

## 2021-08-09 MED ORDER — PRENATAL MULTIVITAMIN CH
1.0000 | ORAL_TABLET | Freq: Every day | ORAL | Status: DC
  Administered 2021-08-10: 1 via ORAL
  Filled 2021-08-09 (×2): qty 1

## 2021-08-09 MED ORDER — BETAMETHASONE SOD PHOS & ACET 6 (3-3) MG/ML IJ SUSP
12.0000 mg | INTRAMUSCULAR | Status: AC
  Administered 2021-08-09 – 2021-08-10 (×2): 12 mg via INTRAMUSCULAR
  Filled 2021-08-09: qty 5

## 2021-08-09 MED ORDER — LACTATED RINGERS IV SOLN: INTRAVENOUS | Status: DC

## 2021-08-09 MED ORDER — DOCUSATE SODIUM 100 MG PO CAPS
100.0000 mg | ORAL_CAPSULE | Freq: Every day | ORAL | Status: DC
  Administered 2021-08-10 – 2021-08-11 (×2): 100 mg via ORAL
  Filled 2021-08-09 (×2): qty 1

## 2021-08-09 MED ORDER — SODIUM CHLORIDE 0.9 % IV SOLN
Freq: Once | INTRAVENOUS | Status: AC
Start: 2021-08-09 — End: 2021-08-09

## 2021-08-09 MED ORDER — ACETAMINOPHEN 325 MG PO TABS: 650.0000 mg | ORAL_TABLET | ORAL | Status: DC | PRN

## 2021-08-09 MED ORDER — ZOLPIDEM TARTRATE 5 MG PO TABS: 5.0000 mg | ORAL_TABLET | Freq: Every evening | ORAL | Status: DC | PRN

## 2021-08-09 MED ORDER — CALCIUM CARBONATE ANTACID 500 MG PO CHEW
2.0000 | CHEWABLE_TABLET | ORAL | Status: DC | PRN
  Administered 2021-08-10: 400 mg via ORAL
  Filled 2021-08-09: qty 2

## 2021-08-09 NOTE — MAU Provider Note (Signed)
Chief Complaint:  Vaginal Bleeding and Contractions ? ? Event Date/Time  ? First Provider Initiated Contact with Patient 08/09/21 2011   ? HPI: Kathleen Logan is a 36 y.o. [phone] at 74w1dwho presents to maternity admissions reporting onset of vaginal bleeding at 1830 this evening.  Has been having some intermittent contractions which are painful. Kathleen Logan ?She reports good fetal movement, denies LOF, vaginal bleeding, vaginal itching/burning, urinary symptoms, h/a, dizziness, n/v, diarrhea, constipation or fever/chills.  She denies headache, visual changes or RUQ abdominal pain. ? ?Vaginal Bleeding ?The patient's primary symptoms include pelvic pain and vaginal bleeding. The patient's pertinent negatives include no genital itching, genital lesions or genital odor. This is a new problem. The current episode started today. The pain is moderate. The problem affects both sides. She is pregnant. Associated symptoms include abdominal pain. Pertinent negatives include no diarrhea, dysuria or fever. The vaginal discharge was bloody. The vaginal bleeding is heavier than menses. She has been passing clots. She has not been passing tissue. Nothing aggravates the symptoms. She has tried nothing for the symptoms. Her past medical history is significant for a Cesarean section and an ectopic pregnancy.  ? ? ?RN Note: ?[name] is a 36 y.o. at [redacted]w[redacted]d here in MAU reporting: she noticed vaginal bleeding about one hour ago and started having contractions at that time. Was at MD office today (no SVE) and was told everything was okay except baby was measuring bid. Pt reports she has been having pelvic pain for a while and has reported this to her doctor. Reports positive fetal movement.   ?Onset of complaint: one hour ago ?Pain score: 7/10 ? ?Past Medical History: ?Past Medical History:  ?Diagnosis Date  ? IBS (irritable bowel syndrome)   ? No pertinent past medical history   ? ? ?Past obstetric history: ?OB History  ?Gravida Para  Term Preterm AB Living  ?7 2 2  0 4 2  ?SAB IAB Ectopic Multiple Live Births  ?2 1 1  0 2  ?  ?# Outcome Date GA Lbr Len/2nd Weight Sex Delivery Anes PTL Lv  ?7 Current           ?6 SAB 11/01/20     SAB     ?5 SAB 07/02/20     Biochemical     ?4 Term 05/22/16 [redacted]w[redacted]d 02:39 / 00:09 3265 g M VBAC None  LIV  ?3 Ectopic 08/02/15 [redacted]w[redacted]d    ECTOPIC     ?2 IAB 08/02/14 [redacted]w[redacted]d    TAB     ?   Complications: Trisomy 21  ?1 Term 03/29/11 [redacted]w[redacted]d  3255 g F CS-LTranv Spinal  LIV  ? ? ? ?Past Surgical History: ?Past Surgical History:  ?Procedure Laterality Date  ? CESAREAN SECTION  03/29/2011  ? Procedure: CESAREAN SECTION;  Surgeon: Shelly Bombard, MD;  Location: [facility];  Service: Gynecology;  Laterality: N/A;  Primary Cesarean Section Delivery Girl @ [phone], Apgars 8/9  ? DIAGNOSTIC LAPAROSCOPY WITH REMOVAL OF ECTOPIC PREGNANCY N/A 07/05/2015  ? Procedure: DIAGNOSTIC LAPAROSCOPY WITH REMOVAL OF ECTOPIC PREGNANCY;  Surgeon: Marylynn Pearson, MD;  Location: [facility];  Service: Gynecology;  Laterality: N/A;  ? DILATION AND CURETTAGE OF UTERUS    ? ? ?Family History: ?Family History  ?Problem Relation Age of Onset  ? Stroke Maternal Aunt   ? Arthritis Maternal Aunt   ? Cancer Maternal Aunt   ? Diabetes Maternal Grandmother   ? Hypertension Maternal Grandmother   ? Hypertension Mother   ? Arthritis  Maternal Uncle   ? Arthritis Paternal Aunt   ? Arthritis Paternal Uncle   ? Diabetes Paternal Grandmother   ? Hypertension Paternal Grandmother   ? Cancer Paternal Grandfather   ? ? ?Social History: ?Social History  ? ?Tobacco Use  ? Smoking status: Never  ? Smokeless tobacco: Never  ?Vaping Use  ? Vaping Use: Never used  ?Substance Use Topics  ? Alcohol use: Not Currently  ?  Alcohol/week: 2.0 standard drinks  ?  Types: 2 Glasses of wine per week  ?  Comment: occ  ? Drug use: No  ? ? ?Allergies: No Known Allergies ? ?Meds:  ?Medications Prior to Admission  ?Medication Sig Dispense Refill Last Dose  ? cyclobenzaprine (FLEXERIL) 10 MG tablet Take 1  tablet (10 mg total) by mouth every 8 (eight) hours as needed for muscle spasms. 30 tablet 1 08/08/2021  ? Prenatal Vit-Fe Fumarate-FA (MULTIVITAMIN-PRENATAL) 27-0.8 MG TABS tablet Take 1 tablet by mouth daily at 12 noon.   08/09/2021  ? acetaminophen (TYLENOL) 500 MG tablet Take 500 mg by mouth every 6 (six) hours as needed for mild pain.     ? albuterol (PROVENTIL HFA;VENTOLIN HFA) 108 (90 BASE) MCG/ACT inhaler Inhale 2 puffs into the lungs every 6 (six) hours as needed for wheezing or shortness of breath.   Unknown  ? ? ?I have reviewed patient's Past Medical Hx, Surgical Hx, Family Hx, Social Hx, medications and allergies.  ? ?ROS:  ?Review of Systems  ?Constitutional:  Negative for fever.  ?Gastrointestinal:  Positive for abdominal pain. Negative for diarrhea.  ?Genitourinary:  Positive for pelvic pain and vaginal bleeding. Negative for dysuria.  ?Other systems negative ? ?Physical Exam  ?Patient Vitals for the past 24 hrs: ? BP Temp Pulse Resp SpO2  ?08/09/21 1955 136/69 98.5 ?F (36.9 ?C) (!) 103 18 99 %  ? ?Constitutional: Well-developed, well-nourished female in no acute distress.  ?Cardiovascular: normal rate and rhythm ?Respiratory: normal effort ?GI: Abd soft, non-tender, gravid appropriate for gestational age.   No rebound or guarding. ?MS: Extremities nontender, no edema, normal ROM ?Neurologic: Alert and oriented x 4.  ?GU: Neg CVAT. ? ?PELVIC EXAM: Cervix pink, visually closed, without lesion, vaginal walls and external genitalia normal ?Moderate blood in vault ? Dilation: 1 ?Effacement (%): 0 ?Cervical Position: Middle ?Station: Ballotable ?Presentation: Undeterminable ?Exam by:: Jimmye Norman CNM ?  ?FHT:  Baseline 150 , moderate variability, accelerations present, no decelerations ?Contractions: q 5-7 mins Irregular  ?  ?Labs: ?Results for orders placed or performed during the hospital encounter of 08/09/21 (from the past 24 hour(s))  ?Wet prep, genital     Status: Abnormal  ? Collection Time: 08/09/21   8:41 PM  ? Specimen: Vaginal  ?Result Value Ref Range  ? Yeast Wet Prep HPF POC NONE SEEN NONE SEEN  ? Trich, Wet Prep NONE SEEN NONE SEEN  ? Clue Cells Wet Prep HPF POC NONE SEEN NONE SEEN  ? WBC, Wet Prep HPF POC >=10 (A) <10  ? Sperm NONE SEEN   ?Urinalysis, Routine w reflex microscopic Urine, Clean Catch     Status: Abnormal  ? Collection Time: 08/09/21  8:50 PM  ?Result Value Ref Range  ? Color, Urine YELLOW YELLOW  ? APPearance CLEAR CLEAR  ? Specific Gravity, Urine 1.019 1.005 - 1.030  ? pH 6.0 5.0 - 8.0  ? Glucose, UA NEGATIVE NEGATIVE mg/dL  ? Hgb urine dipstick MODERATE (A) NEGATIVE  ? Bilirubin Urine NEGATIVE NEGATIVE  ? Ketones, ur NEGATIVE  NEGATIVE mg/dL  ? Protein, ur NEGATIVE NEGATIVE mg/dL  ? Nitrite NEGATIVE NEGATIVE  ? Leukocytes,Ua NEGATIVE NEGATIVE  ? RBC / HPF 0-5 0 - 5 RBC/hpf  ? WBC, UA 0-5 0 - 5 WBC/hpf  ? Bacteria, UA NONE SEEN NONE SEEN  ? Squamous Epithelial / LPF 0-5 0 - 5  ? Mucus PRESENT   ?CBC     Status: Abnormal  ? Collection Time: 08/09/21  9:13 PM  ?Result Value Ref Range  ? WBC 7.9 4.0 - 10.5 K/uL  ? RBC 3.75 (L) 3.87 - 5.11 MIL/uL  ? Hemoglobin 11.6 (L) 12.0 - 15.0 g/dL  ? HCT 34.9 (L) 36.0 - 46.0 %  ? MCV 93.1 80.0 - 100.0 fL  ? MCH 30.9 26.0 - 34.0 pg  ? MCHC 33.2 30.0 - 36.0 g/dL  ? RDW 13.2 11.5 - 15.5 %  ? Platelets 269 150 - 400 K/uL  ? nRBC 0.0 0.0 - 0.2 %  ?Comprehensive metabolic panel     Status: Abnormal  ? Collection Time: 08/09/21  9:13 PM  ?Result Value Ref Range  ? Sodium 137 135 - 145 mmol/L  ? Potassium 3.2 (L) 3.5 - 5.1 mmol/L  ? Chloride 109 98 - 111 mmol/L  ? CO2 22 22 - 32 mmol/L  ? Glucose, Bld 133 (H) 70 - 99 mg/dL  ? BUN 6 6 - 20 mg/dL  ? Creatinine, Ser 0.58 0.44 - 1.00 mg/dL  ? Calcium 9.1 8.9 - 10.3 mg/dL  ? Total Protein 6.0 (L) 6.5 - 8.1 g/dL  ? Albumin 2.6 (L) 3.5 - 5.0 g/dL  ? AST 24 15 - 41 U/L  ? ALT 22 0 - 44 U/L  ? Alkaline Phosphatase 166 (H) 38 - 126 U/L  ? Total Bilirubin 0.4 0.3 - 1.2 mg/dL  ? GFR, Estimated >60 >60 mL/min  ? Anion gap 6 5 -  15  ?Type and screen     Status: None (Preliminary result)  ? Collection Time: 08/09/21  9:14 PM  ?Result Value Ref Range  ? ABO/RH(D) PENDING   ? Antibody Screen PENDING   ? Sample Expiration    ?  05/1

## 2021-08-09 NOTE — H&P (Signed)
Antepartum History and Physical ? ? ?Kathleen Logan is a 36 y.o. female [phone] that presented for vaginal bleeding, contractions. Patient reports 1 hr prior to presentation, she noted moderate amount of blood into toilet.  Blood was audible and clots present, no bright red blood.  She has since had contractions every five minutes, but these have since slowed to q10 min and less intense following IV fluid bolus in MAU. ? ?She denies LOF otherwise, reports good FM. ? ?She was seen in the office for routine prenatal visit but had no complaints at this time. Did not have cervical check.  ? ?Pregnancy history is notable for prior C section for breech, followed by VBAC at 37+5 weeks with precipitous labor. ? ?OB History   ? ? Gravida  ?7  ? Para  ?2  ? Term  ?2  ? Preterm  ?0  ? AB  ?4  ? Living  ?2  ?  ? ? SAB  ?2  ? IAB  ?1  ? Ectopic  ?1  ? Multiple  ?0  ? Live Births  ?2  ?   ?  ?  ? ?Past Medical History:  ?Diagnosis Date  ? IBS (irritable bowel syndrome)   ? No pertinent past medical history   ? ?Past Surgical History:  ?Procedure Laterality Date  ? CESAREAN SECTION  03/29/2011  ? Procedure: CESAREAN SECTION;  Surgeon: Brock Bad, MD;  Location: [facility];  Service: Gynecology;  Laterality: N/A;  Primary Cesarean Section Delivery Girl @ [phone], Apgars 8/9  ? DIAGNOSTIC LAPAROSCOPY WITH REMOVAL OF ECTOPIC PREGNANCY N/A 07/05/2015  ? Procedure: DIAGNOSTIC LAPAROSCOPY WITH REMOVAL OF ECTOPIC PREGNANCY;  Surgeon: Zelphia Cairo, MD;  Location: [facility];  Service: Gynecology;  Laterality: N/A;  ? DILATION AND CURETTAGE OF UTERUS    ? ?Family History: family history includes Arthritis in her maternal aunt, maternal uncle, paternal aunt, and paternal uncle; Cancer in her maternal aunt and paternal grandfather; Diabetes in her maternal grandmother and paternal grandmother; Hypertension in her maternal grandmother, mother, and paternal grandmother; Stroke in her maternal aunt. ?Social History:  reports that she has never  smoked. She has never used smokeless tobacco. She reports that she does not currently use alcohol after a past usage of about 2.0 standard drinks per week. She reports that she does not use drugs. ? ? ?  ?Maternal Diabetes: No ?Genetic Screening: Normal ?Maternal Ultrasounds/Referrals: Normal ?Fetal Ultrasounds or other Referrals:  None ?Maternal Substance Abuse:  No ?Significant Maternal Medications:  None ?Significant Maternal Lab Results:  GBS unknown ?Other Comments:  None ? ?Review of Systems ?History ?Dilation: 1 ?Effacement (%): 0 ?Station: Ballotable ?Exam by:: Mayford Knife CNM ?Blood pressure 136/69, pulse (!) 103, temperature 98.5 ?F (36.9 ?C), resp. rate 18, SpO2 99 %, currently breastfeeding. ?Exam ?Physical Exam  ? ?Gen: alert, well appearing, no distress ?Chest: nonlabored breathing ?CV: no peripheral edema ?Abdomen: soft, nontender, gravid ?Ext: no evidence of DVT  ? ?Pelvic per MAU provider: ?Cervix 1/thick/high.  Moderate blood in vault but no active bleeding at os ? ?FHT reactive and reassuring. Accels present, no decels. ? ? ?Prenatal labs: ?ABO, Rh: --/--/O POS (05/09 2114) ?Antibody: NEG (05/09 2114) ?Rubella:   ?RPR:    ?HBsAg:    ?HIV:    ?GBS:    ? ?Assessment/Plan: ?Admit to [facility] for observation for vaginal bleeding, contractions.  ?Continuous EFM and toco ?BMZ 5/9 - 5/10 ?Diet: regular ?S/p IVF bolus with improvement in ctx, continue maintenance  IVF at 125 cc/hr of LR.  ?DVT Ppx: SCDs ?GBS collected ?[place] in MAU with no evidence of abruption or previa. Vertex. ?Patient would be candidate for VBAC ?Baby measuring large by fundal height today in office, consider EFW in AM.   ? ?Lyn Henri ?08/09/2021, 10:31 PM ? ? ? ? ?

## 2021-08-09 NOTE — MAU Note (Signed)
.  Kathleen Logan is a 36 y.o. at [redacted]w[redacted]d here in MAU reporting: she noticed vaginal bleeding about one hour ago and started having contractions at that time. Was at MD office today (no SVE) and was told everything was okay except baby was measuring bid. Pt reports she has been having pelvic pain for a while and has reported this to her doctor. Reports positive fetal movement.  ? ?Onset of complaint: one hour ago ?Pain score: 7/10 ?Vitals:  ? 08/09/21 1955  ?BP: 136/69  ?Pulse: (!) 103  ?Resp: 18  ?Temp: 98.5 ?F (36.9 ?C)  ?SpO2: 99%  ?   ?FHT:158 ?Lab orders placed from triage:   ? ?

## 2021-08-10 ENCOUNTER — Inpatient Hospital Stay (HOSPITAL_BASED_OUTPATIENT_CLINIC_OR_DEPARTMENT_OTHER): Payer: BC Managed Care – PPO

## 2021-08-10 DIAGNOSIS — Z3A33 33 weeks gestation of pregnancy: Secondary | ICD-10-CM | POA: Diagnosis not present

## 2021-08-10 DIAGNOSIS — Z3A35 35 weeks gestation of pregnancy: Secondary | ICD-10-CM

## 2021-08-10 DIAGNOSIS — O4693 Antepartum hemorrhage, unspecified, third trimester: Secondary | ICD-10-CM | POA: Diagnosis not present

## 2021-08-10 DIAGNOSIS — O34219 Maternal care for unspecified type scar from previous cesarean delivery: Secondary | ICD-10-CM

## 2021-08-10 LAB — GC/CHLAMYDIA PROBE AMP (~~LOC~~) NOT AT ARMC
Chlamydia: NEGATIVE
Comment: NEGATIVE
Comment: NORMAL
Neisseria Gonorrhea: NEGATIVE

## 2021-08-10 NOTE — Consult Note (Signed)
Maternal-Fetal Medicine (Telephone Consultation) ? ?Name: Kathleen Logan ?DOB: January 31, 1986 ?MRN: [id number] ?Referring Provider: Harold Hedge, MD ? ?Ms. Leiva, [place] [id number] at 33w 2d was admitted yesterday with c/o uterine contractions and vaginal bleeding. She passed some clots. Her contractions are fewer now and the vaginal bleeding has decreased. At [facility] evaluation, the cervix was 1 cm, thick and high. Moderate amount of blood was seen in the vagina. ? ?Since admission, she received the first dose of betamethasone. ? ?Her prenatal course has been, otherwise, uneventful so far. She does not have gestational diabetes and her blood pressures have been normal at prenatal visits. ? ?Obstetric history is significant for a term cesarean delivery in 2012 that was followed by a term VBAC in 2018. Both newborns weighed 7-2 at birth and they are in good health now. ? ?Allergies: NKDA. ? ?Ultrasound ?On ultrasound performed today, the estimated fetal weight is at the 97th percentile and the abdominal circumference measurement is at the 99th percentile. Amniotic fluid is normal and good fetal activity is seen. Cephalic presentation. No evidence of previa. Fetal anatomical survey appears normal but limited by advanced gestational age. ? ?Placenta appears normal and there is no retroplacental hemorrhage. ?Ultrasound has limitations in detecting placental abruption. The estimation of fetal weight is likely to be subject to some error because of fetal head position. ? ?Labs: Hb 11.6, Hct 34.9, PLT 133, WBC 7.9, electrolytes normal except K (3.2), ALT 22, AST 24. Blood type O positive. ? ?Vitals: 120/57, pulse 97/min, RR 17, afebrile. ?NST is reactive. ? ?Third-trimester vaginal bleeding ?I discussed the possible causes of third-trimester vaginal bleeding. She does not have placenta previa or local causes. Placental abruption is a likely cause and I informed her that ultrasound has limitations in detecting placental abruption and can  be missed. ? ?Expectant management should continue till 37 weeks' gestation. Her hemoglobin is 11.2 g/dL and I reassured her that blood transfusion is not indicated now even though it is a possibility if she continues to have vaginal bleeding. ? ?I recommended inpatient management till she has no vaginal bleeding for at least 48 hours. If the patient has recurrence of heavy bleeding that does not lead to delivery, I recommend inpatient management till delivery. ? ?I discussed timing of delivery. If bleeding does not recur, I recommend delivery at 37 weeks' gestation. If patient has recurrence of vaginal bleeding (even if mild) at 36 weeks, she should be delivered. ?Mode of delivery ?Patient had a successful VBAC, and she is likely to have a successful vaginal delivery. I discussed the possible complication of oxytocin induction that carries a small scar rupture rate of 2% to 3%. Patient is keen on vaginal delivery. ? ?She reported that she did not have epidural analgesia in her previous pregnancy. I encouraged her to have epidural catheter placed at the start of induction of labor since emergency cesarean section may be required. ? ?Recommendations ?-Complete betamethasone course. ?-Continue inpatient management till she stops having vaginal bleeding for at least 48 hours. Shared decision with patient on discharge. ?-Delivery at 37 weeks' gestation or earlier as mentioned above. ?-If induction is decided, epidural catheter to be placed at the start of induction. ?-BPP on Monday, 08/15/21 if she has not been discharged. ? ?Thank you for consultation.  If you have any questions or concerns, please contact me the [facility].   ? ? ? ? ?

## 2021-08-10 NOTE — Progress Notes (Signed)
Was feeling UCs yesterday on admission. Now much milder and irregular ?Scant bleeding on pad continues and small amount with wiping. ?No abdominal pain ? ?Today's Vitals  ? 08/09/21 1955 08/09/21 1956 08/09/21 2240 08/10/21 0348  ?BP: 136/69  133/66 (!) 118/56  ?Pulse: (!) 103  (!) 102 (!) 105  ?Resp: 18  18 16   ?Temp: 98.5 ?F (36.9 ?C)  98.3 ?F (36.8 ?C) 98.1 ?F (36.7 ?C)  ?TempSrc:   Oral Oral  ?SpO2: 99%  99% 99%  ?PainSc:  7     ? ?There is no height or weight on file to calculate BMI.  ? ?Uterus soft, NT ?FHT cat one ?UCs UI ? ?BMZ #1 done ? ?A/P: 33 2/7 wks ?        Vaginal bleeding much improved ?        Hx of C/S for breech with one successful VBAC ?        Will continue IV fluids, continuous monitoring until bleeding stops ?        BMZ this pm on schedule ?        MFM U/S for growth ?        D/W possible emergent C/S for fetal distress, heavy bleeding, etc ?

## 2021-08-11 DIAGNOSIS — O4693 Antepartum hemorrhage, unspecified, third trimester: Principal | ICD-10-CM | POA: Diagnosis present

## 2021-08-11 LAB — CULTURE, BETA STREP (GROUP B ONLY)

## 2021-08-11 NOTE — Progress Notes (Signed)
No current c/o.  Maybe one CTX felt today; dramatic improvement from admission.  Light pink/brown discharge when wiping only; none in underwear.  Active FM.   ? ? ?  08/11/2021  ?  7:27 AM 08/11/2021  ?  4:52 AM 08/11/2021  ? 12:04 AM  ?Vitals with BMI  ?Systolic [id number]  ?Diastolic 55 53 59  ?Pulse 93 91 94  ? ?FHT Cat I ?Toco flat ? ?U/S yesterday EFW 6#1 (97%) with large AC, vertex ?No previa or visible abruption ? ?Gen: A&O x 3 ?Abd: soft, NT ?Ext: no c/c/e ? ?HD3 36 yo [id number] at [redacted]w[redacted]d with vaginal bleeding ?-D/c continuous monitoring; NST q shift and PRN CTX, etc. ?-Saline lock IVF ?-S/P BMZ ?-Inpatient until no VB x 48 h ?-Delivery by 37 weeks; patient plans TOLAC ?-If not discharged before Monday, do BPP then ? ?Linda Hedges, DO ?

## 2021-08-12 NOTE — Discharge Summary (Addendum)
? ?  ANtepartum Discharge Summary ? ?Date of Service updated 08/13/21 ? ?   ?Patient Name: Kathleen Logan ?DOB: May 09, 1985 ?MRN: [id number] ? ?Date of admission: 08/09/2021 ?Date of discharge: 08/13/2021 ? ?Admitting diagnosis: Vaginal bleeding during pregnancy [O46.90] ?Vaginal bleeding in pregnancy, third trimester [O46.93] ?Intrauterine pregnancy: [redacted]w[redacted]d     ?Secondary diagnosis:  Principal Problem: ?  Vaginal bleeding during pregnancy ?Active Problems: ?  Vaginal bleeding in pregnancy, third trimester ? ?Additional problems: None    ?Discharge diagnosis:  Vaginal bleeding in third trimester                                               ? ?Hospital course: 36 yo [id number] presenting at 33.1wga with vaginal bleeding. She spent 5 days in the hospital. Vaginal bleeding improved greatly after the first day and decreased to spotting/pink with wiping. She was monitored x 92 hours and on day of discharge had >48 hours without any further vaginal bleeding. She reported no pain. She was 1cm dilated on admission and did not changed her cervix. She received BMZ 5/9-5/10. She has a hx of CS for breech with one successful VBAC.  ?BMZ received: Yes ? ? ?Physical exam  ?Vitals:  ? 08/12/21 1928 08/12/21 2228 08/13/21 0601 08/13/21 1478  ?BP: 135/78 126/65 (!) 104/49 (!) 97/52  ?Pulse: 78 88 83 91  ?Resp: 16 17 14 14   ?Temp: 97.7 ?F (36.5 ?C) 98.1 ?F (36.7 ?C) 98.2 ?F (36.8 ?C) 97.8 ?F (36.6 ?C)  ?TempSrc: Oral Oral Oral Oral  ?SpO2: 100% 100% 100% 97%  ? ?General: alert ?Abd: soft, nontender, gravid ?GYN: no blood on pad ?DVT Evaluation: No evidence of DVT seen on physical exam. ?Labs: ?Lab Results  ?Component Value Date  ? WBC 7.9 08/09/2021  ? HGB 11.6 (L) 08/09/2021  ? HCT 34.9 (L) 08/09/2021  ? MCV 93.1 08/09/2021  ? PLT 269 08/09/2021  ? ? ?  Latest Ref Rng & Units 08/09/2021  ?  9:13 PM  ?CMP  ?Glucose 70 - 99 mg/dL 10/09/2021    ?BUN 6 - 20 mg/dL 6    ?Creatinine 0.44 - 1.00 mg/dL 295    ?Sodium 135 - 145 mmol/L 137    ?Potassium 3.5  - 5.1 mmol/L 3.2    ?Chloride 98 - 111 mmol/L 109    ?CO2 22 - 32 mmol/L 22    ?Calcium 8.9 - 10.3 mg/dL 9.1    ?Total Protein 6.5 - 8.1 g/dL 6.0    ?Total Bilirubin 0.3 - 1.2 mg/dL 0.4    ?Alkaline Phos 38 - 126 U/L 166    ?AST 15 - 41 U/L 24    ?ALT 0 - 44 U/L 22    ? ?Edinburgh Score: ?   ? View : No data to display.  ?  ?  ?  ? ? ? ? ?After visit meds:  ?Allergies as of 08/13/2021   ?No Known Allergies ?  ? ?  ?Medication List  ?  ? ?STOP taking these medications   ? ?cyclobenzaprine 10 MG tablet ?Commonly known as: FLEXERIL ?  ? ?  ? ?TAKE these medications   ? ?acetaminophen 500 MG tablet ?Commonly known as: TYLENOL ?Take 500 mg by mouth every 6 (six) hours as needed for mild pain. ?  ?albuterol 108 (90 Base) MCG/ACT inhaler ?Commonly known as: VENTOLIN HFA ?Inhale  2 puffs into the lungs every 6 (six) hours as needed for wheezing or shortness of breath. ?  ?multivitamin-prenatal 27-0.8 MG Tabs tablet ?Take 1 tablet by mouth daily at 12 noon. ?  ? ?  ? ? ? ?Discharge home in stable condition ?Follow up Visit: See the office in one week. Please call to make an appointment at [facility].,  ? ? ?  ? ?08/13/2021 ?Ranae Pila, MD ? ? ?

## 2021-08-12 NOTE — Progress Notes (Signed)
S: Doing well. Continues to deny overt vaginal bleeding. Did have pink when wiped overnight and a brown spot with mucus. Still no spotting/bleeding in underwear - only spotting with wiping. Continues to deny pain. No current c/o.   Active FM.   ? ? ?  08/12/2021  ?  8:19 AM 08/12/2021  ?  6:14 AM 08/11/2021  ? 11:58 PM  ?Vitals with BMI  ?Systolic 115 101 741  ?Diastolic 63 55 62  ?Pulse 79 86 87  ? ?FHT Cat I ?Toco flat ? ?U/S 5/10  EFW 6#1 (97%) with large AC, vertex ?No previa or visible abruption ? ?Gen: A&O x 3 ?Abd: soft, NT ?SVE deferred ?Ext: no c/c/e ? ?HD4 36 yo [id number] at [redacted]w[redacted]d with vaginal bleeding that has now resolved. Now only scant pink/brown with wiping.  ?- NST q shift and PRN CTX, etc. ?-Saline lock IVF ?-S/P BMZ ?-Inpatient until no VB x 48 h ?-Delivery by 37 weeks; patient plans TOLAC ?-If not discharged before Monday, do BPP then ? ? ?Belva Agee MD ?

## 2021-08-13 LAB — TYPE AND SCREEN
ABO/RH(D): O POS
Antibody Screen: NEGATIVE
Donor AG Type: NEGATIVE
Donor AG Type: NEGATIVE
Unit division: 0
Unit division: 0

## 2021-08-13 LAB — BPAM RBC
Blood Product Expiration Date: 202306062359
Blood Product Expiration Date: 202306062359
Unit Type and Rh: 5100
Unit Type and Rh: 5100

## 2021-08-31 ENCOUNTER — Telehealth (HOSPITAL_COMMUNITY): Payer: Self-pay | Admitting: *Deleted

## 2021-08-31 NOTE — Telephone Encounter (Signed)
Preadmission screen  

## 2021-09-01 ENCOUNTER — Telehealth (HOSPITAL_COMMUNITY): Payer: Self-pay | Admitting: *Deleted

## 2021-09-01 LAB — OB RESULTS CONSOLE GBS: GBS: NEGATIVE

## 2021-09-01 NOTE — Telephone Encounter (Signed)
Preadmission screen  

## 2021-09-02 ENCOUNTER — Telehealth (HOSPITAL_COMMUNITY): Payer: Self-pay | Admitting: *Deleted

## 2021-09-02 NOTE — Telephone Encounter (Signed)
Preadmission screen  

## 2021-09-05 ENCOUNTER — Encounter (HOSPITAL_COMMUNITY): Payer: Self-pay | Admitting: *Deleted

## 2021-09-05 ENCOUNTER — Telehealth (HOSPITAL_COMMUNITY): Payer: Self-pay | Admitting: *Deleted

## 2021-09-05 NOTE — Telephone Encounter (Signed)
Preadmission screen  

## 2021-09-09 ENCOUNTER — Inpatient Hospital Stay (HOSPITAL_COMMUNITY): Payer: BC Managed Care – PPO

## 2021-09-09 ENCOUNTER — Inpatient Hospital Stay (HOSPITAL_COMMUNITY): Payer: BC Managed Care – PPO | Admitting: Anesthesiology

## 2021-09-09 ENCOUNTER — Encounter (HOSPITAL_COMMUNITY): Payer: Self-pay | Admitting: Obstetrics and Gynecology

## 2021-09-09 ENCOUNTER — Inpatient Hospital Stay (HOSPITAL_COMMUNITY)
Admission: AD | Admit: 2021-09-09 | Discharge: 2021-09-11 | DRG: 806 | Disposition: A | Discharge status: Home / Self Care | Payer: BC Managed Care – PPO | Attending: Obstetrics and Gynecology | Admitting: Obstetrics and Gynecology

## 2021-09-09 ENCOUNTER — Other Ambulatory Visit: Payer: Self-pay

## 2021-09-09 DIAGNOSIS — O4693 Antepartum hemorrhage, unspecified, third trimester: Principal | ICD-10-CM | POA: Diagnosis present

## 2021-09-09 DIAGNOSIS — Z3A37 37 weeks gestation of pregnancy: Secondary | ICD-10-CM | POA: Diagnosis not present

## 2021-09-09 DIAGNOSIS — O8612 Endometritis following delivery: Secondary | ICD-10-CM | POA: Diagnosis not present

## 2021-09-09 DIAGNOSIS — O34219 Maternal care for unspecified type scar from previous cesarean delivery: Secondary | ICD-10-CM | POA: Diagnosis present

## 2021-09-09 DIAGNOSIS — O26893 Other specified pregnancy related conditions, third trimester: Secondary | ICD-10-CM | POA: Diagnosis present

## 2021-09-09 LAB — CBC
HCT: 38.4 % (ref 36.0–46.0)
Hemoglobin: 12.9 g/dL (ref 12.0–15.0)
MCH: 31.2 pg (ref 26.0–34.0)
MCHC: 33.6 g/dL (ref 30.0–36.0)
MCV: 92.8 fL (ref 80.0–100.0)
Platelets: 281 10*3/uL (ref 150–400)
RBC: 4.14 MIL/uL (ref 3.87–5.11)
RDW: 13.8 % (ref 11.5–15.5)
WBC: 12.5 10*3/uL — ABNORMAL HIGH (ref 4.0–10.5)
nRBC: 0 % (ref 0.0–0.2)

## 2021-09-09 LAB — RPR: RPR Ser Ql: NONREACTIVE

## 2021-09-09 MED ORDER — TETANUS-DIPHTH-ACELL PERTUSSIS 5-2.5-18.5 LF-MCG/0.5 IM SUSY: 0.5000 mL | PREFILLED_SYRINGE | Freq: Once | INTRAMUSCULAR | Status: DC

## 2021-09-09 MED ORDER — PRENATAL MULTIVITAMIN CH
1.0000 | ORAL_TABLET | Freq: Every day | ORAL | Status: DC
  Administered 2021-09-10 – 2021-09-11 (×2): 1 via ORAL
  Filled 2021-09-09 (×2): qty 1

## 2021-09-09 MED ORDER — PHENYLEPHRINE 80 MCG/ML (10ML) SYRINGE FOR IV PUSH (FOR BLOOD PRESSURE SUPPORT)
80.0000 ug | PREFILLED_SYRINGE | INTRAVENOUS | Status: DC | PRN
Start: 2021-09-09 — End: 2021-09-09

## 2021-09-09 MED ORDER — DIPHENHYDRAMINE HCL 25 MG PO CAPS: 25.0000 mg | ORAL_CAPSULE | Freq: Four times a day (QID) | ORAL | Status: DC | PRN

## 2021-09-09 MED ORDER — ONDANSETRON HCL 4 MG/2ML IJ SOLN: 4.0000 mg | Freq: Four times a day (QID) | INTRAMUSCULAR | Status: DC | PRN

## 2021-09-09 MED ORDER — LIDOCAINE HCL (PF) 1 % IJ SOLN: 30.0000 mL | INTRAMUSCULAR | Status: DC | PRN

## 2021-09-09 MED ORDER — OXYCODONE-ACETAMINOPHEN 5-325 MG PO TABS: 2.0000 | ORAL_TABLET | ORAL | Status: DC | PRN

## 2021-09-09 MED ORDER — ACETAMINOPHEN 325 MG PO TABS
650.0000 mg | ORAL_TABLET | ORAL | Status: DC | PRN
  Administered 2021-09-09: 650 mg via ORAL
  Filled 2021-09-09: qty 2

## 2021-09-09 MED ORDER — OXYCODONE-ACETAMINOPHEN 5-325 MG PO TABS: 1.0000 | ORAL_TABLET | ORAL | Status: DC | PRN

## 2021-09-09 MED ORDER — ALBUTEROL SULFATE (2.5 MG/3ML) 0.083% IN NEBU
3.0000 mL | INHALATION_SOLUTION | Freq: Four times a day (QID) | RESPIRATORY_TRACT | Status: DC | PRN
Start: 2021-09-09 — End: 2021-09-11

## 2021-09-09 MED ORDER — EPHEDRINE 5 MG/ML INJ
10.0000 mg | INTRAVENOUS | Status: DC | PRN
Start: 2021-09-09 — End: 2021-09-09

## 2021-09-09 MED ORDER — FENTANYL CITRATE (PF) 100 MCG/2ML IJ SOLN
100.0000 ug | INTRAMUSCULAR | Status: DC | PRN
  Administered 2021-09-09 (×2): 100 ug via INTRAVENOUS
  Filled 2021-09-09 (×2): qty 2

## 2021-09-09 MED ORDER — IBUPROFEN 600 MG PO TABS
600.0000 mg | ORAL_TABLET | Freq: Once | ORAL | Status: AC
  Administered 2021-09-09: 600 mg via ORAL
  Filled 2021-09-09: qty 1

## 2021-09-09 MED ORDER — OXYTOCIN-SODIUM CHLORIDE 30-0.9 UT/500ML-% IV SOLN
1.0000 m[IU]/min | INTRAVENOUS | Status: DC
  Administered 2021-09-09: 1 m[IU]/min via INTRAVENOUS
  Filled 2021-09-09: qty 500

## 2021-09-09 MED ORDER — DIBUCAINE (PERIANAL) 1 % EX OINT
1.0000 | TOPICAL_OINTMENT | CUTANEOUS | Status: DC | PRN
Start: 2021-09-09 — End: 2021-09-11

## 2021-09-09 MED ORDER — IBUPROFEN 600 MG PO TABS
600.0000 mg | ORAL_TABLET | Freq: Four times a day (QID) | ORAL | Status: DC
  Administered 2021-09-09: 600 mg via ORAL
  Filled 2021-09-09: qty 1

## 2021-09-09 MED ORDER — SENNOSIDES-DOCUSATE SODIUM 8.6-50 MG PO TABS
2.0000 | ORAL_TABLET | Freq: Every day | ORAL | Status: DC
  Administered 2021-09-10 – 2021-09-11 (×2): 2 via ORAL
  Filled 2021-09-09 (×2): qty 2

## 2021-09-09 MED ORDER — PHENYLEPHRINE 80 MCG/ML (10ML) SYRINGE FOR IV PUSH (FOR BLOOD PRESSURE SUPPORT): 80.0000 ug | PREFILLED_SYRINGE | INTRAVENOUS | Status: DC | PRN

## 2021-09-09 MED ORDER — OXYTOCIN BOLUS FROM INFUSION
333.0000 mL | Freq: Once | INTRAVENOUS | Status: AC
  Administered 2021-09-09: 333 mL via INTRAVENOUS

## 2021-09-09 MED ORDER — OXYTOCIN-SODIUM CHLORIDE 30-0.9 UT/500ML-% IV SOLN: 1.0000 m[IU]/min | INTRAVENOUS | Status: DC

## 2021-09-09 MED ORDER — SOD CITRATE-CITRIC ACID 500-334 MG/5ML PO SOLN: 30.0000 mL | ORAL | Status: DC | PRN

## 2021-09-09 MED ORDER — LACTATED RINGERS IV SOLN: 500.0000 mL | INTRAVENOUS | Status: DC | PRN

## 2021-09-09 MED ORDER — EPHEDRINE 5 MG/ML INJ: 10.0000 mg | INTRAVENOUS | Status: DC | PRN

## 2021-09-09 MED ORDER — LIDOCAINE HCL (PF) 1 % IJ SOLN
INTRAMUSCULAR | Status: DC | PRN
  Administered 2021-09-09: 11 mL via EPIDURAL

## 2021-09-09 MED ORDER — ACETAMINOPHEN 325 MG PO TABS: 650.0000 mg | ORAL_TABLET | ORAL | Status: DC | PRN

## 2021-09-09 MED ORDER — OXYCODONE-ACETAMINOPHEN 5-325 MG PO TABS
2.0000 | ORAL_TABLET | ORAL | Status: DC | PRN
  Administered 2021-09-11: 2 via ORAL
  Filled 2021-09-09: qty 2

## 2021-09-09 MED ORDER — LACTATED RINGERS IV SOLN: 500.0000 mL | Freq: Once | INTRAVENOUS | Status: DC

## 2021-09-09 MED ORDER — SIMETHICONE 80 MG PO CHEW: 80.0000 mg | CHEWABLE_TABLET | ORAL | Status: DC | PRN

## 2021-09-09 MED ORDER — OXYCODONE-ACETAMINOPHEN 5-325 MG PO TABS
1.0000 | ORAL_TABLET | ORAL | Status: DC | PRN
  Administered 2021-09-09 – 2021-09-10 (×3): 1 via ORAL
  Filled 2021-09-09 (×3): qty 1

## 2021-09-09 MED ORDER — LACTATED RINGERS IV SOLN: INTRAVENOUS | Status: DC

## 2021-09-09 MED ORDER — WITCH HAZEL-GLYCERIN EX PADS: 1.0000 "application " | MEDICATED_PAD | CUTANEOUS | Status: DC | PRN

## 2021-09-09 MED ORDER — DIPHENHYDRAMINE HCL 50 MG/ML IJ SOLN: 12.5000 mg | INTRAMUSCULAR | Status: DC | PRN

## 2021-09-09 MED ORDER — ONDANSETRON HCL 4 MG/2ML IJ SOLN: 4.0000 mg | INTRAMUSCULAR | Status: DC | PRN

## 2021-09-09 MED ORDER — MEDROXYPROGESTERONE ACETATE 150 MG/ML IM SUSP: 150.0000 mg | INTRAMUSCULAR | Status: DC | PRN

## 2021-09-09 MED ORDER — ZOLPIDEM TARTRATE 5 MG PO TABS: 5.0000 mg | ORAL_TABLET | Freq: Every evening | ORAL | Status: DC | PRN

## 2021-09-09 MED ORDER — BENZOCAINE-MENTHOL 20-0.5 % EX AERO
1.0000 "application " | INHALATION_SPRAY | CUTANEOUS | Status: DC | PRN
  Administered 2021-09-09: 1 via TOPICAL
  Filled 2021-09-09: qty 56

## 2021-09-09 MED ORDER — COCONUT OIL OIL: 1.0000 "application " | TOPICAL_OIL | Status: DC | PRN

## 2021-09-09 MED ORDER — TERBUTALINE SULFATE 1 MG/ML IJ SOLN: 0.2500 mg | Freq: Once | INTRAMUSCULAR | Status: DC | PRN

## 2021-09-09 MED ORDER — FENTANYL-BUPIVACAINE-NACL 0.5-0.125-0.9 MG/250ML-% EP SOLN
12.0000 mL/h | EPIDURAL | Status: DC | PRN
  Administered 2021-09-09: 12 mL/h via EPIDURAL
  Filled 2021-09-09: qty 250

## 2021-09-09 MED ORDER — ONDANSETRON HCL 4 MG PO TABS: 4.0000 mg | ORAL_TABLET | ORAL | Status: DC | PRN

## 2021-09-09 MED ORDER — MEASLES, MUMPS & RUBELLA VAC IJ SOLR: 0.5000 mL | Freq: Once | INTRAMUSCULAR | Status: DC

## 2021-09-09 MED ORDER — OXYTOCIN-SODIUM CHLORIDE 30-0.9 UT/500ML-% IV SOLN
2.5000 [IU]/h | INTRAVENOUS | Status: DC
Start: 2021-09-09 — End: 2021-09-09

## 2021-09-09 NOTE — H&P (Signed)
Kathleen Logan is a 36 y.o. female presenting for IOL at term due to history of vaginal bleeding and previous c/s with VBAC.  Was admitted at 35 weeks for vaginal bleeding and received BMZ series.  MFM recommended delivery at 37 weeks.  GBS neg. OB History     Gravida  7   Para  2   Term  2   Preterm  0   AB  4   Living  2      SAB  2   IAB  1   Ectopic  1   Multiple  0   Live Births  2          Past Medical History:  Diagnosis Date   IBS (irritable bowel syndrome)    No pertinent past medical history    Vaginal bleeding in pregnancy, third trimester    Past Surgical History:  Procedure Laterality Date   CESAREAN SECTION  03/29/2011   Procedure: CESAREAN SECTION;  Surgeon: Brock Bad, MD;  Location: [facility];  Service: Gynecology;  Laterality: N/A;  Primary Cesarean Section Delivery Girl @ [phone], Apgars 8/9   DIAGNOSTIC LAPAROSCOPY WITH REMOVAL OF ECTOPIC PREGNANCY N/A 07/05/2015   Procedure: DIAGNOSTIC LAPAROSCOPY WITH REMOVAL OF ECTOPIC PREGNANCY;  Surgeon: Zelphia Cairo, MD;  Location: [facility];  Service: Gynecology;  Laterality: N/A;   DILATION AND CURETTAGE OF UTERUS     Family History: family history includes Arthritis in her maternal aunt, maternal uncle, paternal aunt, and paternal uncle; Cancer in her maternal aunt and paternal grandfather; Diabetes in her maternal grandmother and paternal grandmother; Hypertension in her maternal grandmother, mother, and paternal grandmother; Stroke in her maternal aunt. Social History:  reports that she has never smoked. She has never used smokeless tobacco. She reports that she does not currently use alcohol after a past usage of about 2.0 standard drinks of alcohol per week. She reports that she does not use drugs.     Maternal Diabetes: No Genetic Screening: Normal Maternal Ultrasounds/Referrals: Normal Fetal Ultrasounds or other Referrals:  Referred to Materal Fetal Medicine  Maternal Substance Abuse:   No Significant Maternal Medications:  None Significant Maternal Lab Results:  Group B Strep negative Other Comments:  None  Review of Systems History Dilation: 4 Effacement (%): 80 Station: -1 Exam by:: Dr. Rana Snare Blood pressure 120/72, pulse 98, temperature 98.2 F (36.8 C), temperature source Oral, resp. rate 16, height 5\' 5"  (1.651 m), weight 86.6 kg, SpO2 100 %, currently breastfeeding. Exam Physical Exam  Vitals and nursing note reviewed. Exam conducted with a chaperone present.  Constitutional:      Appearance: Normal appearance.  HENT:     Head: Normocephalic.  Eyes:     Pupils: Pupils are equal, round, and reactive to light.  Cardiovascular:     Rate and Rhythm: Normal rate and regular rhythm.     Pulses: Normal pulses.  Abdominal:     General: Abdomen is Gravid, nontender Neurological:     Mental Status: She is alert.  Prenatal labs: ABO, Rh: --/--/O POS (06/09 0028) Antibody: NEG (06/09 0028) Rubella: Immune (11/10 0000) RPR: NON REACTIVE (06/09 0028)  HBsAg: Negative (11/10 0000)  HIV: Non-reactive (03/30 0000)  GBS: Negative/-- (06/01 0000)   Assessment/Plan: IUP at 37 4/7 Term with vaginal bleeding with MFM rec for delivery now S/P BMZ Previous C/S and VBAC desires TOLAC.  R&B discussed Rec CLEA AROM/Pit and anticipate SVD   6/7 09/09/2021, 9:45 AM

## 2021-09-09 NOTE — Anesthesia Procedure Notes (Signed)
Epidural Patient location during procedure: OB Start time: 09/09/2021 8:30 AM End time: 09/09/2021 8:45 AM  Staffing Anesthesiologist: Lowella Curb, MD Performed: anesthesiologist   Preanesthetic Checklist Completed: patient identified, IV checked, site marked, risks and benefits discussed, surgical consent, monitors and equipment checked, pre-op evaluation and timeout performed  Epidural Patient position: sitting Prep: ChloraPrep Patient monitoring: heart rate, cardiac monitor, continuous pulse ox and blood pressure Approach: midline Location: L2-L3 Injection technique: LOR saline  Needle:  Needle type: Tuohy  Needle gauge: 17 G Needle length: 9 cm Needle insertion depth: 6 cm Catheter type: closed end flexible Catheter size: 20 Guage Catheter at skin depth: 10 cm Test dose: negative  Assessment Events: blood not aspirated, injection not painful, no injection resistance, no paresthesia and negative IV test  Additional Notes Reason for block:procedure for pain

## 2021-09-09 NOTE — Plan of Care (Signed)
  Problem: Education: Goal: Knowledge of condition will improve Outcome: Completed/Met   Problem: Activity: Goal: Will verbalize the importance of balancing activity with adequate rest periods Outcome: Completed/Met Goal: Ability to tolerate increased activity will improve Outcome: Completed/Met   Problem: Coping: Goal: Ability to identify and utilize available resources and services will improve Outcome: Completed/Met   Problem: Life Cycle: Goal: Chance of risk for complications during the postpartum period will decrease Outcome: Completed/Met   Problem: Role Relationship: Goal: Ability to demonstrate positive interaction with newborn will improve Outcome: Completed/Met   Problem: Skin Integrity: Goal: Demonstration of wound healing without infection will improve Outcome: Completed/Met   

## 2021-09-09 NOTE — Lactation Note (Signed)
This note was copied from a baby's chart. Lactation Consultation Note  Patient Name: Kathleen Logan [id number] Date: 09/09/2021   Age:35 hours Mom declined [place] services in L&D and MBU, experienced at breastfeeding, BF other two children for 2 years each, has [place], there is no [place] charge.  Maternal Data    Feeding    LATCH Score   Lactation Tools Discussed/Used    Interventions    Discharge    Consult Status Consult Status: Complete    Vicente Serene 09/09/2021, 4:25 PM

## 2021-09-09 NOTE — Anesthesia Preprocedure Evaluation (Signed)
Anesthesia Evaluation  Patient identified by MRN, date of birth, ID band Patient awake    Reviewed: Allergy & Precautions, NPO status , Patient's Chart, lab work & pertinent test results  Airway Mallampati: II  TM Distance: >3 FB Neck ROM: Full    Dental no notable dental hx.    Pulmonary neg pulmonary ROS,    Pulmonary exam normal breath sounds clear to auscultation       Cardiovascular negative cardio ROS Normal cardiovascular exam Rhythm:Regular Rate:Normal     Neuro/Psych negative neurological ROS  negative psych ROS   GI/Hepatic negative GI ROS, Neg liver ROS,   Endo/Other  negative endocrine ROS  Renal/GU negative Renal ROS  negative genitourinary   Musculoskeletal negative musculoskeletal ROS (+)   Abdominal   Peds negative pediatric ROS (+)  Hematology negative hematology ROS (+)   Anesthesia Other Findings   Reproductive/Obstetrics (+) Pregnancy                             Anesthesia Physical Anesthesia Plan  ASA: 2  Anesthesia Plan: Epidural   Post-op Pain Management:    Induction:   PONV Risk Score and Plan:   Airway Management Planned:   Additional Equipment:   Intra-op Plan:   Post-operative Plan:   Informed Consent:   Plan Discussed with:   Anesthesia Plan Comments:         Anesthesia Quick Evaluation  

## 2021-09-10 LAB — CBC
HCT: 35.3 % — ABNORMAL LOW (ref 36.0–46.0)
Hemoglobin: 11.6 g/dL — ABNORMAL LOW (ref 12.0–15.0)
MCH: 30.8 pg (ref 26.0–34.0)
MCHC: 32.9 g/dL (ref 30.0–36.0)
MCV: 93.6 fL (ref 80.0–100.0)
Platelets: 250 10*3/uL (ref 150–400)
RBC: 3.77 MIL/uL — ABNORMAL LOW (ref 3.87–5.11)
RDW: 14.2 % (ref 11.5–15.5)
WBC: 21.1 10*3/uL — ABNORMAL HIGH (ref 4.0–10.5)
nRBC: 0 % (ref 0.0–0.2)

## 2021-09-10 MED ORDER — DIPHENHYDRAMINE HCL 25 MG PO CAPS: 25.0000 mg | ORAL_CAPSULE | Freq: Four times a day (QID) | ORAL | Status: DC | PRN

## 2021-09-10 MED ORDER — SODIUM CHLORIDE 0.9 % IV SOLN: 1.0000 g | Freq: Two times a day (BID) | INTRAVENOUS | Status: DC

## 2021-09-10 MED ORDER — CEFOTETAN DISODIUM 2 G IJ SOLR
2.0000 g | Freq: Two times a day (BID) | INTRAMUSCULAR | Status: DC
  Administered 2021-09-10 – 2021-09-11 (×3): 2 g via INTRAVENOUS
  Filled 2021-09-10 (×4): qty 2

## 2021-09-10 MED ORDER — IBUPROFEN 600 MG PO TABS
600.0000 mg | ORAL_TABLET | Freq: Four times a day (QID) | ORAL | Status: DC
  Administered 2021-09-10 – 2021-09-11 (×6): 600 mg via ORAL
  Filled 2021-09-10 (×6): qty 1

## 2021-09-10 NOTE — Anesthesia Postprocedure Evaluation (Signed)
Anesthesia Post Note  Patient: Kathleen Logan  Procedure(s) Performed: AN AD HOC LABOR EPIDURAL     Patient location during evaluation: Mother Baby Anesthesia Type: Epidural Level of consciousness: awake and alert Pain management: pain level controlled Vital Signs Assessment: post-procedure vital signs reviewed and stable Respiratory status: spontaneous breathing, nonlabored ventilation and respiratory function stable Cardiovascular status: stable Postop Assessment: no headache, no backache and epidural receding Anesthetic complications: no   No notable events documented.  Last Vitals:  Vitals:   09/09/21 2338 09/10/21 0403  BP: 123/73 (!) 105/58  Pulse: 88 (!) 101  Resp:  18  Temp: 36.9 C 36.8 C  SpO2:      Last Pain:  Vitals:   09/10/21 0438  TempSrc:   PainSc: 6    Pain Goal:                   [place]

## 2021-09-10 NOTE — Progress Notes (Signed)
Post Partum Day 1 Subjective: tolerating PO Uterine tenderness  Objective: Blood pressure (!) 105/58, pulse (!) 101, temperature 98.2 F (36.8 C), temperature source Oral, resp. rate 18, height 5\' 5"  (1.651 m), weight 86.6 kg, SpO2 100 %, unknown if currently breastfeeding.  Physical Exam:  General: alert, cooperative, appears stated age, and mild distress Lochia: appropriate Uterine Fundus: firm, 2/5 tender Incision:   DVT Evaluation: No evidence of DVT seen on physical exam.  Recent Labs    09/09/21 0028 09/10/21 0424  HGB 12.9 11.6*  HCT 38.4 35.3*    Assessment/Plan: Circumcision prior to discharge Endometritis - elevated WBC and uterine tenderness and tmax 103 yesterday, Afebrile today(was only ROM for 4 hours yesterday).  Will start IV abx    LOS: 1 day   11/10/21 09/10/2021, 9:07 AM

## 2021-09-11 LAB — CBC WITH DIFFERENTIAL/PLATELET
Abs Immature Granulocytes: 0.11 10*3/uL — ABNORMAL HIGH (ref 0.00–0.07)
Basophils Absolute: 0 10*3/uL (ref 0.0–0.1)
Basophils Relative: 0 %
Eosinophils Absolute: 0.2 10*3/uL (ref 0.0–0.5)
Eosinophils Relative: 2 %
HCT: 34.9 % — ABNORMAL LOW (ref 36.0–46.0)
Hemoglobin: 11.9 g/dL — ABNORMAL LOW (ref 12.0–15.0)
Immature Granulocytes: 1 %
Lymphocytes Relative: 18 %
Lymphs Abs: 2.2 10*3/uL (ref 0.7–4.0)
MCH: 31.8 pg (ref 26.0–34.0)
MCHC: 34.1 g/dL (ref 30.0–36.0)
MCV: 93.3 fL (ref 80.0–100.0)
Monocytes Absolute: 0.9 10*3/uL (ref 0.1–1.0)
Monocytes Relative: 7 %
Neutro Abs: 9 10*3/uL — ABNORMAL HIGH (ref 1.7–7.7)
Neutrophils Relative %: 72 %
Platelets: 261 10*3/uL (ref 150–400)
RBC: 3.74 MIL/uL — ABNORMAL LOW (ref 3.87–5.11)
RDW: 14.3 % (ref 11.5–15.5)
WBC: 12.5 10*3/uL — ABNORMAL HIGH (ref 4.0–10.5)
nRBC: 0 % (ref 0.0–0.2)

## 2021-09-11 MED ORDER — OXYCODONE-ACETAMINOPHEN 5-325 MG PO TABS: 1.0000 | ORAL_TABLET | ORAL | 0 refills | Status: DC | PRN

## 2021-09-11 MED ORDER — OXYCODONE HCL 5 MG PO TABS
5.0000 mg | ORAL_TABLET | ORAL | Status: DC | PRN
  Administered 2021-09-11: 5 mg via ORAL
  Filled 2021-09-11: qty 1

## 2021-09-11 MED ORDER — IBUPROFEN 600 MG PO TABS: 600.0000 mg | ORAL_TABLET | Freq: Four times a day (QID) | ORAL | 0 refills | Status: AC

## 2021-09-11 MED ORDER — AMOXICILLIN-POT CLAVULANATE 875-125 MG PO TABS
1.0000 | ORAL_TABLET | Freq: Two times a day (BID) | ORAL | 0 refills | Status: AC
Start: 2021-09-11 — End: ?

## 2021-09-11 MED ORDER — OXYCODONE HCL 5 MG PO TABS: 5.0000 mg | ORAL_TABLET | ORAL | 0 refills | Status: AC | PRN

## 2021-09-11 NOTE — Progress Notes (Signed)
Rn encouraged MOB not to sleep with baby. Rn removed baby from crib. Rn educated on safe sleep.

## 2021-09-11 NOTE — Discharge Summary (Signed)
Postpartum Discharge Summary       Patient Name: Kathleen Logan DOB: 04/30/1985 MRN: [id number]  Date of admission: 09/09/2021 Delivery date:09/09/2021  Delivering provider: Louretta Shorten  Date of discharge: 09/11/2021  Admitting diagnosis: Vaginal bleeding in pregnancy, third trimester [O46.93] Intrauterine pregnancy: [redacted]w[redacted]d     Secondary diagnosis:  Principal Problem:   Vaginal bleeding in pregnancy, third trimester  Additional problems: endometritis    Discharge diagnosis: Term Pregnancy Delivered                                              Post partum procedures:   Augmentation: AROM and Pitocin Complications: None  Hospital course: Induction of Labor With Vaginal Delivery   36 y.o. yo [id number] at [redacted]w[redacted]d was admitted to the hospital 09/09/2021 for induction of labor.  Indication for induction:  vaginal bleeding .  Patient had an uncomplicated labor course as follows: Membrane Rupture Time/Date: 9:34 AM ,09/09/2021   Delivery Method:VBAC, Spontaneous  Episiotomy: None  Lacerations:  1st degree;Perineal  Details of delivery can be found in separate delivery note.  Patient had a routine postpartum course. Patient is discharged home 09/11/21.  Newborn Data: Birth date:09/09/2021  Birth time:1:09 PM  Gender:Female  Living status:Living  Apgars:8 ,9  Weight:3370 g   Magnesium Sulfate received: No BMZ received: Yes Rhophylac:N/A MMR:N/A T-DaP:Given prenatally Flu: N/A Transfusion:No  Physical exam  Vitals:   09/10/21 0403 09/10/21 1309 09/10/21 2025 09/11/21 0500  BP: (!) 105/58 119/64 127/68 128/70  Pulse: (!) 101 87 75 80  Resp: $Remo'18 20 18 18  'pSSlE$ Temp: 98.2 F (36.8 C) 98.3 F (36.8 C) 97.8 F (36.6 C) 98.4 F (36.9 C)  TempSrc: Oral  Oral Oral  SpO2:      Weight:      Height:       General: alert, cooperative, and no distress Lochia: appropriate Uterine Fundus: firm Incision: N/A DVT Evaluation: No evidence of DVT seen on physical exam. Labs: Lab Results   Component Value Date   WBC 12.5 (H) 09/11/2021   HGB 11.9 (L) 09/11/2021   HCT 34.9 (L) 09/11/2021   MCV 93.3 09/11/2021   PLT 261 09/11/2021      Latest Ref Rng & Units 08/09/2021    9:13 PM  CMP  Glucose 70 - 99 mg/dL 133   BUN 6 - 20 mg/dL 6   Creatinine 0.44 - 1.00 mg/dL 0.58   Sodium 135 - 145 mmol/L 137   Potassium 3.5 - 5.1 mmol/L 3.2   Chloride 98 - 111 mmol/L 109   CO2 22 - 32 mmol/L 22   Calcium 8.9 - 10.3 mg/dL 9.1   Total Protein 6.5 - 8.1 g/dL 6.0   Total Bilirubin 0.3 - 1.2 mg/dL 0.4   Alkaline Phos 38 - 126 U/L 166   AST 15 - 41 U/L 24   ALT 0 - 44 U/L 22    Edinburgh Score:    09/09/2021    3:50 PM  Edinburgh Postnatal Depression Scale Screening Tool  I have been able to laugh and see the funny side of things. 0  I have looked forward with enjoyment to things. 0  I have blamed myself unnecessarily when things went wrong. 2  I have been anxious or worried for no good reason. 1  I have felt scared or panicky for no good reason.  1  Things have been getting on top of me. 1  I have been so unhappy that I have had difficulty sleeping. 1  I have felt sad or miserable. 1  I have been so unhappy that I have been crying. 0  The thought of harming myself has occurred to me. 0  Edinburgh Postnatal Depression Scale Total 7      After visit meds:  Allergies as of 09/11/2021   No Known Allergies      Medication List     TAKE these medications    acetaminophen 500 MG tablet Commonly known as: TYLENOL Take 500 mg by mouth every 6 (six) hours as needed for mild pain.   albuterol 108 (90 Base) MCG/ACT inhaler Commonly known as: VENTOLIN HFA Inhale 2 puffs into the lungs every 6 (six) hours as needed for wheezing or shortness of breath.   amoxicillin-clavulanate 875-125 MG tablet Commonly known as: AUGMENTIN Take 1 tablet by mouth 2 (two) times daily.   ibuprofen 600 MG tablet Commonly known as: ADVIL Take 1 tablet (600 mg total) by mouth every 6 (six)  hours.   multivitamin-prenatal 27-0.8 MG Tabs tablet Take 1 tablet by mouth daily at 12 noon.   oxyCODONE-acetaminophen 5-325 MG tablet Commonly known as: PERCOCET/ROXICET Take 1 tablet by mouth every 4 (four) hours as needed (pain scale 4-7).         Discharge home in stable condition Infant Feeding: Breast Infant Disposition:home with mother Discharge instruction: per After Visit Summary and Postpartum booklet. Activity: Advance as tolerated. Pelvic rest for 6 weeks.  Diet: routine diet Anticipated Birth Control: Unsure Postpartum Appointment:6 weeks Additional Postpartum F/U:    Future Appointments:No future appointments. Follow up Visit:      09/11/2021 Luz Lex, MD

## 2021-09-13 LAB — BPAM RBC
Blood Product Expiration Date: 202306292359
Blood Product Expiration Date: 202307052359
Blood Product Expiration Date: 202307052359
Unit Type and Rh: 5100
Unit Type and Rh: 5100
Unit Type and Rh: 5100

## 2021-09-13 LAB — TYPE AND SCREEN
ABO/RH(D): O POS
Antibody Screen: NEGATIVE
Donor AG Type: NEGATIVE
Donor AG Type: NEGATIVE
Unit division: 0
Unit division: 0
Unit division: 0

## 2021-09-13 LAB — SURGICAL PATHOLOGY

## 2021-09-19 ENCOUNTER — Telehealth (HOSPITAL_COMMUNITY): Payer: Self-pay

## 2021-09-19 NOTE — Telephone Encounter (Signed)
No answer. Left message to return nurse call.  Marcelino Duster [facility] 06/19//2023,1440
